# Patient Record
Sex: Male | Born: 1950 | Race: White | Hispanic: No
Health system: Western US, Academic
[De-identification: ages and names within clinical notes are randomized; demographics above are authoritative.]

## PROBLEM LIST (undated history)

## (undated) DIAGNOSIS — E119 Type 2 diabetes mellitus without complications: Secondary | ICD-10-CM

## (undated) DIAGNOSIS — I4891 Unspecified atrial fibrillation: Secondary | ICD-10-CM

## (undated) DIAGNOSIS — I2510 Atherosclerotic heart disease of native coronary artery without angina pectoris: Secondary | ICD-10-CM

## (undated) DIAGNOSIS — I639 Cerebral infarction, unspecified: Secondary | ICD-10-CM

---

## 2017-09-10 ENCOUNTER — Inpatient Hospital Stay (HOSPITAL_COMMUNITY)
Admission: EM | Admit: 2017-09-10 | Discharge: 2017-09-14 | DRG: 303 | Discharge status: Against Medical Advice | Payer: Medicare Other | Attending: Internal Medicine | Admitting: Internal Medicine

## 2017-09-10 ENCOUNTER — Emergency Department (HOSPITAL_COMMUNITY): Payer: Medicare Other

## 2017-09-10 ENCOUNTER — Encounter (HOSPITAL_COMMUNITY): Payer: Self-pay | Admitting: Emergency Medicine

## 2017-09-10 DIAGNOSIS — I252 Old myocardial infarction: Secondary | ICD-10-CM | POA: Diagnosis not present

## 2017-09-10 DIAGNOSIS — I110 Hypertensive heart disease with heart failure: Secondary | ICD-10-CM | POA: Diagnosis not present

## 2017-09-10 DIAGNOSIS — I447 Left bundle-branch block, unspecified: Secondary | ICD-10-CM | POA: Diagnosis present

## 2017-09-10 DIAGNOSIS — I482 Chronic atrial fibrillation: Secondary | ICD-10-CM | POA: Diagnosis present

## 2017-09-10 DIAGNOSIS — Z7982 Long term (current) use of aspirin: Secondary | ICD-10-CM

## 2017-09-10 DIAGNOSIS — Z955 Presence of coronary angioplasty implant and graft: Secondary | ICD-10-CM | POA: Diagnosis not present

## 2017-09-10 DIAGNOSIS — D649 Anemia, unspecified: Secondary | ICD-10-CM | POA: Diagnosis present

## 2017-09-10 DIAGNOSIS — J449 Chronic obstructive pulmonary disease, unspecified: Secondary | ICD-10-CM | POA: Diagnosis present

## 2017-09-10 DIAGNOSIS — F0390 Unspecified dementia without behavioral disturbance: Secondary | ICD-10-CM | POA: Diagnosis present

## 2017-09-10 DIAGNOSIS — N289 Disorder of kidney and ureter, unspecified: Secondary | ICD-10-CM

## 2017-09-10 DIAGNOSIS — F17210 Nicotine dependence, cigarettes, uncomplicated: Secondary | ICD-10-CM | POA: Diagnosis present

## 2017-09-10 DIAGNOSIS — Z794 Long term (current) use of insulin: Secondary | ICD-10-CM | POA: Diagnosis not present

## 2017-09-10 DIAGNOSIS — Z7951 Long term (current) use of inhaled steroids: Secondary | ICD-10-CM

## 2017-09-10 DIAGNOSIS — Z951 Presence of aortocoronary bypass graft: Secondary | ICD-10-CM

## 2017-09-10 DIAGNOSIS — N179 Acute kidney failure, unspecified: Secondary | ICD-10-CM | POA: Diagnosis not present

## 2017-09-10 DIAGNOSIS — R9439 Abnormal result of other cardiovascular function study: Secondary | ICD-10-CM | POA: Diagnosis present

## 2017-09-10 DIAGNOSIS — Z79899 Other long term (current) drug therapy: Secondary | ICD-10-CM

## 2017-09-10 DIAGNOSIS — I25110 Atherosclerotic heart disease of native coronary artery with unstable angina pectoris: Principal | ICD-10-CM | POA: Diagnosis present

## 2017-09-10 DIAGNOSIS — I493 Ventricular premature depolarization: Secondary | ICD-10-CM | POA: Diagnosis present

## 2017-09-10 DIAGNOSIS — R079 Chest pain, unspecified: Secondary | ICD-10-CM | POA: Diagnosis present

## 2017-09-10 DIAGNOSIS — I459 Conduction disorder, unspecified: Secondary | ICD-10-CM | POA: Diagnosis present

## 2017-09-10 DIAGNOSIS — I480 Paroxysmal atrial fibrillation: Secondary | ICD-10-CM | POA: Diagnosis present

## 2017-09-10 DIAGNOSIS — Z95810 Presence of automatic (implantable) cardiac defibrillator: Secondary | ICD-10-CM

## 2017-09-10 DIAGNOSIS — I5022 Chronic systolic (congestive) heart failure: Secondary | ICD-10-CM | POA: Diagnosis not present

## 2017-09-10 DIAGNOSIS — E119 Type 2 diabetes mellitus without complications: Secondary | ICD-10-CM | POA: Diagnosis present

## 2017-09-10 DIAGNOSIS — Z8673 Personal history of transient ischemic attack (TIA), and cerebral infarction without residual deficits: Secondary | ICD-10-CM

## 2017-09-10 DIAGNOSIS — I255 Ischemic cardiomyopathy: Secondary | ICD-10-CM | POA: Diagnosis not present

## 2017-09-10 DIAGNOSIS — I200 Unstable angina: Secondary | ICD-10-CM

## 2017-09-10 DIAGNOSIS — Z5321 Procedure and treatment not carried out due to patient leaving prior to being seen by health care provider: Secondary | ICD-10-CM | POA: Diagnosis present

## 2017-09-10 DIAGNOSIS — I2510 Atherosclerotic heart disease of native coronary artery without angina pectoris: Secondary | ICD-10-CM | POA: Diagnosis present

## 2017-09-10 DIAGNOSIS — E785 Hyperlipidemia, unspecified: Secondary | ICD-10-CM | POA: Diagnosis not present

## 2017-09-10 DIAGNOSIS — Z7901 Long term (current) use of anticoagulants: Secondary | ICD-10-CM | POA: Diagnosis not present

## 2017-09-10 DIAGNOSIS — Z79891 Long term (current) use of opiate analgesic: Secondary | ICD-10-CM

## 2017-09-10 HISTORY — DX: Type 2 diabetes mellitus without complications: E11.9

## 2017-09-10 HISTORY — DX: Unspecified atrial fibrillation: I48.91

## 2017-09-10 HISTORY — DX: Cerebral infarction, unspecified: I63.9

## 2017-09-10 HISTORY — DX: Atherosclerotic heart disease of native coronary artery without angina pectoris: I25.10

## 2017-09-10 LAB — BASIC METABOLIC PANEL
Anion gap: 11 (ref 5–15)
BUN: 14 mg/dL (ref 8–23)
CALCIUM: 8.8 mg/dL — AB (ref 8.9–10.3)
CO2: 28 mmol/L (ref 22–32)
CREATININE: 1.44 mg/dL — AB (ref 0.61–1.24)
Chloride: 97 mmol/L — ABNORMAL LOW (ref 98–111)
GFR calc Af Amer: 56 mL/min — ABNORMAL LOW (ref >60)
GFR calc non Af Amer: 48 mL/min — ABNORMAL LOW (ref >60)
Glucose, Bld: 260 mg/dL — ABNORMAL HIGH (ref 70–99)
Potassium: 4.3 mmol/L (ref 3.5–5.1)
SODIUM: 136 mmol/L (ref 135–145)

## 2017-09-10 LAB — CBC
HCT: 37.5 % — ABNORMAL LOW (ref 39.0–52.0)
Hemoglobin: 11.6 g/dL — ABNORMAL LOW (ref 13.0–17.0)
MCH: 29.2 pg (ref 26.0–34.0)
MCHC: 30.9 g/dL (ref 30.0–36.0)
MCV: 94.5 fL (ref 78.0–100.0)
PLATELETS: 177 10*3/uL (ref 150–400)
RBC: 3.97 MIL/uL — ABNORMAL LOW (ref 4.22–5.81)
RDW: 20.6 % — ABNORMAL HIGH (ref 11.5–15.5)
WBC: 5.8 10*3/uL (ref 4.0–10.5)

## 2017-09-10 LAB — I-STAT TROPONIN, ED: TROPONIN I, POC: 0 ng/mL (ref 0.00–0.08)

## 2017-09-10 LAB — PROTIME-INR
INR: 1.19
PROTHROMBIN TIME: 15 s (ref 11.4–15.2)

## 2017-09-10 IMAGING — CR DG CHEST 2V
3 series · 3 of 3 positions shown · non-contrast
Comparison: None.

CLINICAL DATA: Acute chest pain and shortness of breath today.

EXAM:
CHEST - 2 VIEW

[chest lat (1 of 2)]
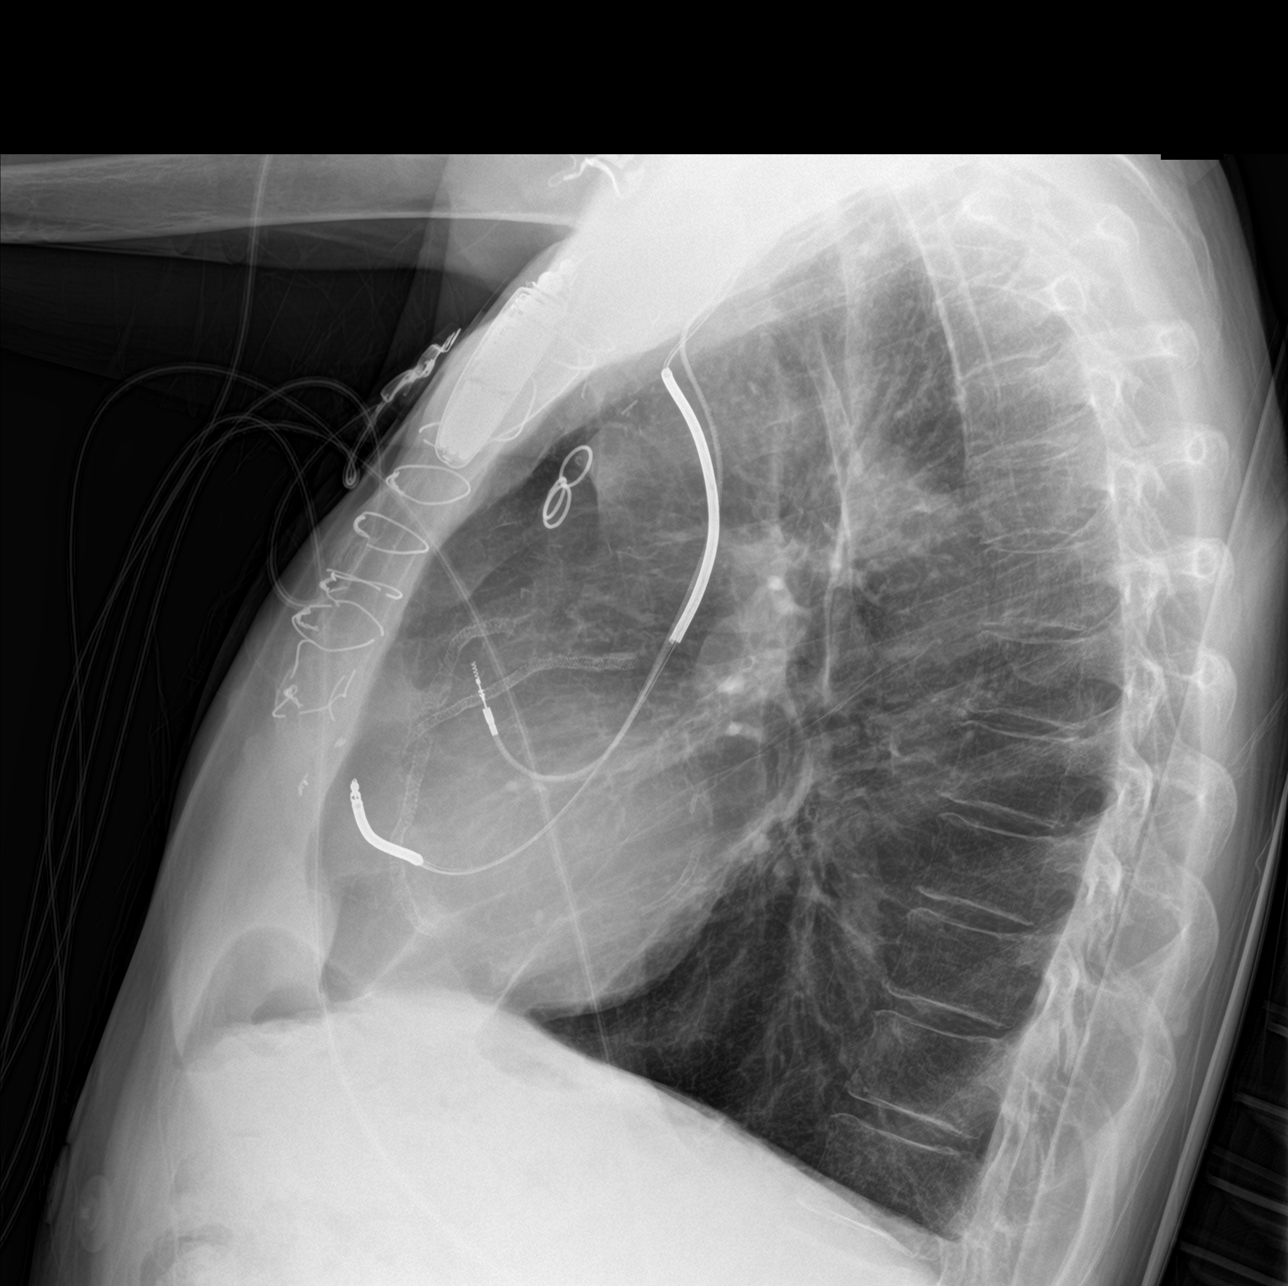

[chest ap]
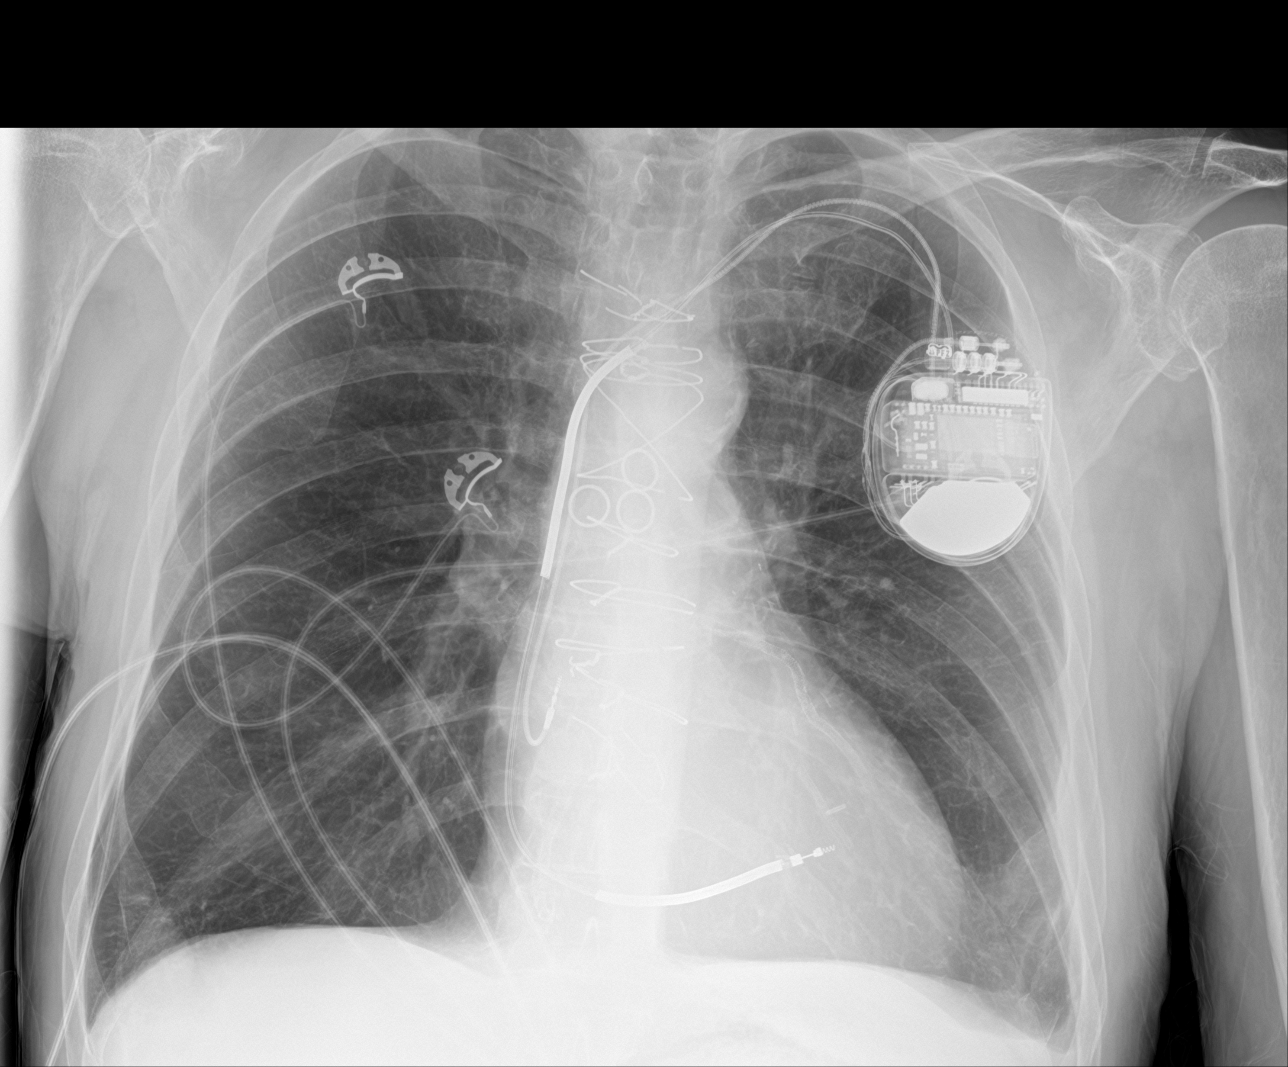

[chest lat (2 of 2)]
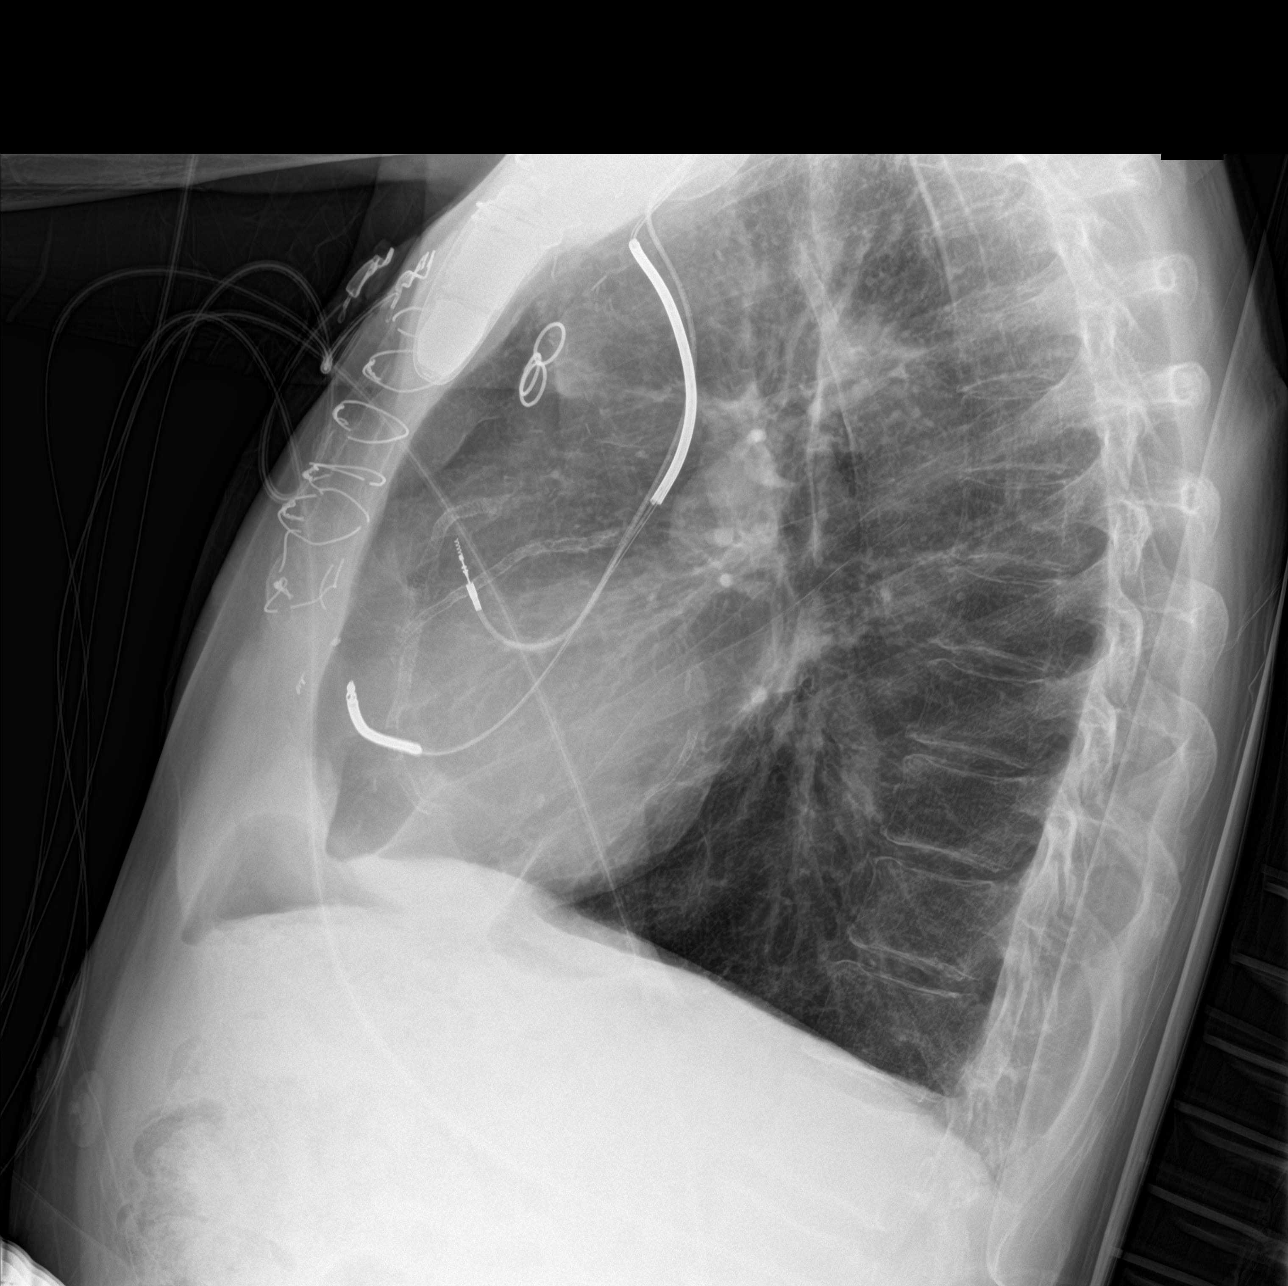

[3 of 3 positions shown; findings below may reference images not displayed]

FINDINGS: The cardiomediastinal silhouette is unremarkable.

CABG changes, coronary stents and LEFT AICD/pacemaker noted.

There is no evidence of focal airspace disease, pulmonary edema,
suspicious pulmonary nodule/mass, pleural effusion, or pneumothorax.

No acute bony abnormalities are identified.
IMPRESSION: No evidence of acute cardiopulmonary disease.

## 2017-09-10 MED ORDER — ASPIRIN 81 MG PO CHEW
324.0000 mg | CHEWABLE_TABLET | Freq: Once | ORAL | Status: AC
  Administered 2017-09-10: 324 mg via ORAL
  Filled 2017-09-10: qty 4

## 2017-09-10 NOTE — ED Provider Notes (Signed)
[facility] EMERGENCY DEPARTMENT Provider Note   CSN: [id number] Arrival date & time: 09/10/17  2224     History   Chief Complaint Chief Complaint  Patient presents with  . Chest Pain    HPI Larry Leach is a 67 y.o. male.  The history is provided by the patient and a relative. The history is limited by the condition of the patient (level 5 dementia).  Chest Pain   This is a recurrent problem. The current episode started 1 to 2 hours ago. The problem occurs constantly. The problem has not changed since onset.The pain is associated with rest (watching tv). The pain is present in the substernal region. The pain is moderate. The quality of the pain is described as dull. The pain radiates to the left neck and left shoulder. Exacerbated by: none. Associated symptoms include nausea. Pertinent negatives include no abdominal pain, no cough, no diaphoresis, no dizziness, no fever, no hemoptysis, no lower extremity edema, no orthopnea, no palpitations and no vomiting. He has tried nothing for the symptoms. The treatment provided no relief. Risk factors include being elderly and male gender.  His past medical history is significant for CAD and MI.  Pertinent negatives for past medical history include no aneurysm.  Pertinent negatives for family medical history include: no aortic dissection.  Procedure history is positive for cardiac catheterization.    Past Medical History:  Diagnosis Date  . Coronary artery disease   . Diabetes mellitus without complication (HCC)   . Stroke [facility])     There are no active problems to display for this patient.   History reviewed. No pertinent surgical history.      Home Medications    Prior to Admission medications   Not on File    Family History No family history on file.  Social History Social History   Tobacco Use  . Smoking status: Not on file  Substance Use Topics  . Alcohol use: Not on file  . Drug use: Not on  file     Allergies   Patient has no known allergies.   Review of Systems Review of Systems  Constitutional: Negative for diaphoresis and fever.  Eyes: Negative for photophobia.  Respiratory: Negative for cough and hemoptysis.   Cardiovascular: Positive for chest pain. Negative for palpitations, orthopnea and leg swelling.  Gastrointestinal: Positive for nausea. Negative for abdominal pain and vomiting.  Genitourinary: Negative for flank pain.  Musculoskeletal: Positive for arthralgias.  Neurological: Negative for dizziness and speech difficulty.  All other systems reviewed and are negative.    Physical Exam Updated Vital Signs BP (!) 143/82 (BP Location: Left Arm)   Pulse 85   Temp 97.7 F (36.5 C) (Oral)   Resp 20   Ht 6' (1.829 m)   Wt 59 kg (130 lb)   SpO2 100%   BMI 17.63 kg/m   Physical Exam  Constitutional: He is oriented to person, place, and time. He appears well-developed and well-nourished.  HENT:  Head: Normocephalic and atraumatic.  Right Ear: External ear normal.  Left Ear: External ear normal.  Mouth/Throat: Oropharynx is clear and moist. No oropharyngeal exudate.  Eyes: Pupils are equal, round, and reactive to light. Conjunctivae are normal.  Neck: Normal range of motion. Neck supple.  Cardiovascular: Normal rate, regular rhythm, normal heart sounds and intact distal pulses.  Pulmonary/Chest: Effort normal and breath sounds normal. No stridor. No respiratory distress. He has no wheezes. He has no rales.  Abdominal: Soft. Bowel sounds  are normal. He exhibits no mass. There is no tenderness. There is no rebound and no guarding.  Musculoskeletal: Normal range of motion. He exhibits no edema or tenderness.  Neurological: He is alert and oriented to person, place, and time. He displays normal reflexes.  Skin: Skin is warm and dry. Capillary refill takes less than 2 seconds. He is not diaphoretic.  Psychiatric: He has a normal mood and affect.     ED  Treatments / Results  Labs (all labs ordered are listed, but only abnormal results are displayed) Results for orders placed or performed during the hospital encounter of 09/10/17  Basic metabolic panel  Result Value Ref Range   Sodium 136 135 - 145 mmol/L   Potassium 4.3 3.5 - 5.1 mmol/L   Chloride 97 (L) 98 - 111 mmol/L   CO2 28 22 - 32 mmol/L   Glucose, Bld 260 (H) 70 - 99 mg/dL   BUN 14 8 - 23 mg/dL   Creatinine, Ser 1.61 (H) 0.61 - 1.24 mg/dL   Calcium 8.8 (L) 8.9 - 10.3 mg/dL   GFR calc non Af Amer 48 (L) >60 mL/min   GFR calc Af Amer 56 (L) >60 mL/min   Anion gap 11 5 - 15  CBC  Result Value Ref Range   WBC 5.8 4.0 - 10.5 K/uL   RBC 3.97 (L) 4.22 - 5.81 MIL/uL   Hemoglobin 11.6 (L) 13.0 - 17.0 g/dL   HCT 09.6 (L) 04.5 - 40.9 %   MCV 94.5 78.0 - 100.0 fL   MCH 29.2 26.0 - 34.0 pg   MCHC 30.9 30.0 - 36.0 g/dL   RDW 81.1 (H) 91.4 - 78.2 %   Platelets 177 150 - 400 K/uL  I-stat troponin, ED  Result Value Ref Range   Troponin i, poc 0.00 0.00 - 0.08 ng/mL   Comment 3           Dg Chest 2 View  Result Date: 09/10/2017 CLINICAL DATA:  Acute chest pain and shortness of breath today. EXAM: CHEST - 2 VIEW COMPARISON:  None. FINDINGS: The cardiomediastinal silhouette is unremarkable. CABG changes, coronary stents and LEFT AICD/pacemaker noted. There is no evidence of focal airspace disease, pulmonary edema, suspicious pulmonary nodule/mass, pleural effusion, or pneumothorax. No acute bony abnormalities are identified. IMPRESSION: No evidence of acute cardiopulmonary disease. Electronically Signed   By: Harmon Pier M.D.   On: 09/10/2017 23:00    EKG EKG Interpretation  Date/Time:  Thursday September 10 2017 22:26:37 EDT Ventricular Rate:  86 PR Interval:  180 QRS Duration: 126 QT Interval:  450 QTC Calculation: 538 R Axis:   100 Text Interpretation:  Atrial-paced rhythm Rightward axis Non-specific intra-ventricular conduction block Possible Inferior infarct , age undetermined  Abnormal ECG No previous ECGs available Confirmed by Frederick Peers [phone]) on 09/10/2017 10:43:39 PM   Radiology Dg Chest 2 View  Result Date: 09/10/2017 CLINICAL DATA:  Acute chest pain and shortness of breath today. EXAM: CHEST - 2 VIEW COMPARISON:  None. FINDINGS: The cardiomediastinal silhouette is unremarkable. CABG changes, coronary stents and LEFT AICD/pacemaker noted. There is no evidence of focal airspace disease, pulmonary edema, suspicious pulmonary nodule/mass, pleural effusion, or pneumothorax. No acute bony abnormalities are identified. IMPRESSION: No evidence of acute cardiopulmonary disease. Electronically Signed   By: Harmon Pier M.D.   On: 09/10/2017 23:00    Procedures Procedures (including critical care time)  Medications Ordered in ED Medications  aspirin chewable tablet 324 mg (324 mg Oral Given 09/10/17  2335)       Final Clinical Impressions(s) / ED Diagnoses   Heart score of 7 will admit to medicine.     Milliana Reddoch, MD 09/10/17 2349

## 2017-09-10 NOTE — ED Triage Notes (Signed)
Pt has extensive cardiac history.  Today he was watching TV he began to experience left sided chest pain, also began to be SOB.  Pt denies sweating but states he was weak and nauseas.  "episodes come and go."  Pain radiates to left shoulder and neck.  [place]

## 2017-09-10 NOTE — ED Notes (Addendum)
Recollect blood for istat trop and blue top

## 2017-09-11 ENCOUNTER — Other Ambulatory Visit: Payer: Self-pay

## 2017-09-11 ENCOUNTER — Encounter (HOSPITAL_COMMUNITY): Payer: Self-pay

## 2017-09-11 DIAGNOSIS — I5022 Chronic systolic (congestive) heart failure: Secondary | ICD-10-CM

## 2017-09-11 DIAGNOSIS — I482 Chronic atrial fibrillation: Secondary | ICD-10-CM | POA: Diagnosis not present

## 2017-09-11 DIAGNOSIS — Z8673 Personal history of transient ischemic attack (TIA), and cerebral infarction without residual deficits: Secondary | ICD-10-CM

## 2017-09-11 DIAGNOSIS — I447 Left bundle-branch block, unspecified: Secondary | ICD-10-CM | POA: Diagnosis not present

## 2017-09-11 DIAGNOSIS — I200 Unstable angina: Secondary | ICD-10-CM | POA: Diagnosis present

## 2017-09-11 DIAGNOSIS — E119 Type 2 diabetes mellitus without complications: Secondary | ICD-10-CM

## 2017-09-11 DIAGNOSIS — R079 Chest pain, unspecified: Secondary | ICD-10-CM | POA: Diagnosis not present

## 2017-09-11 DIAGNOSIS — I110 Hypertensive heart disease with heart failure: Secondary | ICD-10-CM | POA: Diagnosis not present

## 2017-09-11 DIAGNOSIS — I255 Ischemic cardiomyopathy: Secondary | ICD-10-CM | POA: Diagnosis not present

## 2017-09-11 DIAGNOSIS — Z7901 Long term (current) use of anticoagulants: Secondary | ICD-10-CM | POA: Diagnosis not present

## 2017-09-11 DIAGNOSIS — Z5321 Procedure and treatment not carried out due to patient leaving prior to being seen by health care provider: Secondary | ICD-10-CM | POA: Diagnosis not present

## 2017-09-11 DIAGNOSIS — J449 Chronic obstructive pulmonary disease, unspecified: Secondary | ICD-10-CM | POA: Diagnosis not present

## 2017-09-11 DIAGNOSIS — N179 Acute kidney failure, unspecified: Secondary | ICD-10-CM | POA: Diagnosis not present

## 2017-09-11 DIAGNOSIS — I25700 Atherosclerosis of coronary artery bypass graft(s), unspecified, with unstable angina pectoris: Secondary | ICD-10-CM

## 2017-09-11 DIAGNOSIS — F0390 Unspecified dementia without behavioral disturbance: Secondary | ICD-10-CM | POA: Diagnosis not present

## 2017-09-11 DIAGNOSIS — N289 Disorder of kidney and ureter, unspecified: Secondary | ICD-10-CM

## 2017-09-11 DIAGNOSIS — D649 Anemia, unspecified: Secondary | ICD-10-CM | POA: Diagnosis not present

## 2017-09-11 DIAGNOSIS — Z95810 Presence of automatic (implantable) cardiac defibrillator: Secondary | ICD-10-CM | POA: Diagnosis not present

## 2017-09-11 DIAGNOSIS — I25119 Atherosclerotic heart disease of native coronary artery with unspecified angina pectoris: Secondary | ICD-10-CM | POA: Diagnosis not present

## 2017-09-11 DIAGNOSIS — Z951 Presence of aortocoronary bypass graft: Secondary | ICD-10-CM | POA: Diagnosis not present

## 2017-09-11 DIAGNOSIS — I480 Paroxysmal atrial fibrillation: Secondary | ICD-10-CM | POA: Diagnosis present

## 2017-09-11 DIAGNOSIS — Z794 Long term (current) use of insulin: Secondary | ICD-10-CM | POA: Diagnosis not present

## 2017-09-11 DIAGNOSIS — I25110 Atherosclerotic heart disease of native coronary artery with unstable angina pectoris: Secondary | ICD-10-CM | POA: Diagnosis not present

## 2017-09-11 LAB — HIV ANTIBODY (ROUTINE TESTING W REFLEX): HIV Screen 4th Generation wRfx: NONREACTIVE

## 2017-09-11 LAB — CBG MONITORING, ED: GLUCOSE-CAPILLARY: 224 mg/dL — AB (ref 70–99)

## 2017-09-11 LAB — BASIC METABOLIC PANEL
ANION GAP: 9 (ref 5–15)
BUN: 12 mg/dL (ref 8–23)
CALCIUM: 8.8 mg/dL — AB (ref 8.9–10.3)
CO2: 30 mmol/L (ref 22–32)
CREATININE: 1.28 mg/dL — AB (ref 0.61–1.24)
Chloride: 105 mmol/L (ref 98–111)
GFR calc Af Amer: >60 mL/min (ref >60)
GFR, EST NON AFRICAN AMERICAN: 56 mL/min — AB (ref >60)
Glucose, Bld: 85 mg/dL (ref 70–99)
Potassium: 4 mmol/L (ref 3.5–5.1)
Sodium: 144 mmol/L (ref 135–145)

## 2017-09-11 LAB — MRSA PCR SCREENING: MRSA by PCR: NEGATIVE

## 2017-09-11 LAB — GLUCOSE, CAPILLARY
GLUCOSE-CAPILLARY: 213 mg/dL — AB (ref 70–99)
GLUCOSE-CAPILLARY: 237 mg/dL — AB (ref 70–99)
Glucose-Capillary: 74 mg/dL (ref 70–99)
Glucose-Capillary: 125 mg/dL — ABNORMAL HIGH (ref 70–99)

## 2017-09-11 LAB — TROPONIN I: TROPONIN I: 0.03 ng/mL — AB (ref <0.03)

## 2017-09-11 MED ORDER — ASPIRIN 325 MG PO TABS
325.0000 mg | ORAL_TABLET | Freq: Every day | ORAL | Status: DC
  Administered 2017-09-11: 325 mg via ORAL
  Filled 2017-09-11: qty 1

## 2017-09-11 MED ORDER — SODIUM CHLORIDE 0.9% FLUSH
3.0000 mL | Freq: Two times a day (BID) | INTRAVENOUS | Status: DC
  Administered 2017-09-11 (×2): 3 mL via INTRAVENOUS

## 2017-09-11 MED ORDER — SODIUM CHLORIDE 0.9 % IV SOLN: 250.0000 mL | INTRAVENOUS | Status: DC | PRN

## 2017-09-11 MED ORDER — ATORVASTATIN CALCIUM 40 MG PO TABS
40.0000 mg | ORAL_TABLET | Freq: Every day | ORAL | Status: DC
  Administered 2017-09-11 – 2017-09-13 (×3): 40 mg via ORAL
  Filled 2017-09-11 (×3): qty 1

## 2017-09-11 MED ORDER — OXYCODONE-ACETAMINOPHEN 5-325 MG PO TABS
2.0000 | ORAL_TABLET | ORAL | Status: DC | PRN
  Administered 2017-09-11 – 2017-09-14 (×16): 2 via ORAL
  Filled 2017-09-11 (×16): qty 2

## 2017-09-11 MED ORDER — ONDANSETRON HCL 4 MG/2ML IJ SOLN: 4.0000 mg | Freq: Four times a day (QID) | INTRAMUSCULAR | Status: DC | PRN

## 2017-09-11 MED ORDER — SODIUM CHLORIDE 0.9% FLUSH: 3.0000 mL | INTRAVENOUS | Status: DC | PRN

## 2017-09-11 MED ORDER — ALPRAZOLAM 0.25 MG PO TABS: 0.2500 mg | ORAL_TABLET | Freq: Two times a day (BID) | ORAL | Status: DC | PRN

## 2017-09-11 MED ORDER — INSULIN ASPART 100 UNIT/ML ~~LOC~~ SOLN
0.0000 [IU] | Freq: Every day | SUBCUTANEOUS | Status: DC
  Administered 2017-09-11 (×2): 2 [IU] via SUBCUTANEOUS
  Filled 2017-09-11: qty 1

## 2017-09-11 MED ORDER — NITROGLYCERIN 0.4 MG SL SUBL
0.4000 mg | SUBLINGUAL_TABLET | SUBLINGUAL | Status: DC | PRN
  Administered 2017-09-11: 0.4 mg via SUBLINGUAL
  Filled 2017-09-11: qty 1

## 2017-09-11 MED ORDER — INSULIN DETEMIR 100 UNIT/ML ~~LOC~~ SOLN
5.0000 [IU] | Freq: Two times a day (BID) | SUBCUTANEOUS | Status: DC
  Administered 2017-09-11 (×2): 5 [IU] via SUBCUTANEOUS
  Filled 2017-09-11 (×3): qty 0.05

## 2017-09-11 MED ORDER — MOMETASONE FURO-FORMOTEROL FUM 200-5 MCG/ACT IN AERO
2.0000 | INHALATION_SPRAY | Freq: Two times a day (BID) | RESPIRATORY_TRACT | Status: DC
  Administered 2017-09-11 – 2017-09-14 (×6): 2 via RESPIRATORY_TRACT
  Filled 2017-09-11 (×2): qty 8.8

## 2017-09-11 MED ORDER — ACETAMINOPHEN 325 MG PO TABS: 650.0000 mg | ORAL_TABLET | ORAL | Status: DC | PRN

## 2017-09-11 MED ORDER — HEPARIN (PORCINE) IN NACL 100-0.45 UNIT/ML-% IJ SOLN
1000.0000 [IU]/h | INTRAMUSCULAR | Status: DC
  Administered 2017-09-11: 750 [IU]/h via INTRAVENOUS
  Administered 2017-09-12 – 2017-09-13 (×2): 1000 [IU]/h via INTRAVENOUS
  Filled 2017-09-11 (×3): qty 250

## 2017-09-11 MED ORDER — INSULIN DETEMIR 100 UNIT/ML ~~LOC~~ SOLN
8.0000 [IU] | Freq: Two times a day (BID) | SUBCUTANEOUS | Status: DC
  Administered 2017-09-11 – 2017-09-13 (×5): 8 [IU] via SUBCUTANEOUS
  Filled 2017-09-11 (×6): qty 0.08

## 2017-09-11 MED ORDER — MORPHINE SULFATE (PF) 4 MG/ML IV SOLN: 2.0000 mg | INTRAVENOUS | Status: DC | PRN

## 2017-09-11 MED ORDER — APIXABAN 5 MG PO TABS
5.0000 mg | ORAL_TABLET | Freq: Two times a day (BID) | ORAL | Status: DC
  Administered 2017-09-11: 5 mg via ORAL
  Filled 2017-09-11 (×2): qty 1

## 2017-09-11 MED ORDER — ASPIRIN 81 MG PO CHEW
81.0000 mg | CHEWABLE_TABLET | Freq: Every day | ORAL | Status: DC
  Administered 2017-09-12 – 2017-09-13 (×2): 81 mg via ORAL
  Filled 2017-09-11 (×2): qty 1

## 2017-09-11 MED ORDER — INSULIN ASPART 100 UNIT/ML ~~LOC~~ SOLN
0.0000 [IU] | Freq: Three times a day (TID) | SUBCUTANEOUS | Status: DC
  Administered 2017-09-11: 1 [IU] via SUBCUTANEOUS
  Administered 2017-09-11: 3 [IU] via SUBCUTANEOUS
  Administered 2017-09-12 (×2): 2 [IU] via SUBCUTANEOUS
  Administered 2017-09-13 (×2): 1 [IU] via SUBCUTANEOUS

## 2017-09-11 MED ORDER — PNEUMOCOCCAL VAC POLYVALENT 25 MCG/0.5ML IJ INJ
0.5000 mL | INJECTION | INTRAMUSCULAR | Status: DC
  Filled 2017-09-11 (×2): qty 0.5

## 2017-09-11 MED ORDER — TIOTROPIUM BROMIDE MONOHYDRATE 18 MCG IN CAPS
18.0000 ug | ORAL_CAPSULE | Freq: Every day | RESPIRATORY_TRACT | Status: DC
  Administered 2017-09-11 – 2017-09-14 (×3): 18 ug via RESPIRATORY_TRACT
  Filled 2017-09-11 (×2): qty 5

## 2017-09-11 NOTE — Plan of Care (Signed)
Pt compliant with use of call light to request assistance, call light within reach, family at bedside.  Raymon MuttonGwen Damisha Wolff RN

## 2017-09-11 NOTE — Progress Notes (Signed)
Pt arrived to unit accompanied by ER nurse and son. CHG wipes used to wipe down patient and MRSA swab obtained. Pt placed on tele. Pt has no complaints. Will continue to monitor the pt.

## 2017-09-11 NOTE — H&P (Signed)
History and Physical    Larry Leach ZOX:[id number] DOB: 05/23/1950 DOA: 09/10/2017  PCP: System, Pcp Not In; visiting from [place]  Patient coming from: Home   Chief Complaint: Chest pain   HPI: Larry Leach is a 67 y.o. male with medical history significant for dementia, history of stroke, coronary artery disease status post CABG, insulin-dependent diabetes mellitus, and paroxysmal atrial fibrillation on Eliquis, now presenting to the emergency department for evaluation of chest pain.  [place] and was at rest, watching TV when he developed acute onset of pain in the left chest.  Pain is moderate in intensity, radiating to the left shoulder and neck, associated with mild dyspnea, waxing and waning, but with no alleviating or exacerbating factors identified.  He reports some mild nausea associated with this but denies diaphoresis.  He has not attempted any interventions for his symptoms.  Denies any recent fevers, chills, or increased cough or dyspnea.  Denies lower extremity swelling or tenderness.  ED Course: Upon arrival to the ED, patient is found to be afebrile, saturating well on room air, and with vitals otherwise normal.  EKG features in atrial-paced rhythm with nonspecific IVCD.  Chest x-ray is negative for acute cardiopulmonary disease.  Chemistry panel is notable for a creatinine of 1.44 and glucose of 260.  CBC features a mild normocytic anemia with hemoglobin of 11.6.  Troponin is undetectable.  Patient was given 324 mg of aspirin in the ED and his pacer is being interrogated.  He remains hemodynamically stable, in no apparent respiratory distress, appears comfortable, but continues to complain of some chest pain and will be admitted to the stepdown unit for ongoing evaluation and management.  Review of Systems:  All other systems reviewed and apart from HPI, are negative.  Past Medical History:  Diagnosis Date  . Coronary artery disease   . Diabetes  mellitus without complication (HCC)   . Stroke [facility])     History reviewed. No pertinent surgical history.   has no tobacco, alcohol, and drug history on file.  No Known Allergies  History reviewed. No pertinent family history.   Prior to Admission medications   Not on File    Physical Exam: Vitals:   09/10/17 2227  BP: (!) 143/82  Pulse: 85  Resp: 20  Temp: 97.7 F (36.5 C)  TempSrc: Oral  SpO2: 100%  Weight: 59 kg (130 lb)  Height: 6' (1.829 m)      Constitutional: NAD, calm, frail  Eyes: PERTLA, lids and conjunctivae normal ENMT: Mucous membranes are moist. Posterior pharynx clear of any exudate or lesions.   Neck: normal, supple, no masses, no thyromegaly Respiratory: breath sounds diminished bilaterally, no wheezing, no crackles. Normal respiratory effort.  Cardiovascular: S1 & S2 heard, regular rate and rhythm. No extremity edema.  Abdomen: No distension, no tenderness, soft. Bowel sounds normal.  Musculoskeletal: no clubbing / cyanosis. No joint deformity upper and lower extremities.  Skin: no significant rashes, lesions, ulcers. Warm, dry, well-perfused. Neurologic: No facial asymmetry. Sensation to light touch intact. Moving all extremities.  Psychiatric: Alert and oriented to person, place, and situation. Calm, cooperative.     Labs on Admission: I have personally reviewed following labs and imaging studies  CBC: Recent Labs  Lab 09/10/17 2238  WBC 5.8  HGB 11.6*  HCT 37.5*  MCV 94.5  PLT 177   Basic Metabolic Panel: Recent Labs  Lab 09/10/17 2238  NA 136  K 4.3  CL 97*  CO2  28  GLUCOSE 260*  BUN 14  CREATININE 1.44*  CALCIUM 8.8*   GFR: Estimated Creatinine Clearance: 41 mL/min (A) (by C-G formula based on SCr of 1.44 mg/dL (H)). Liver Function Tests: No results for input(s): AST, ALT, ALKPHOS, BILITOT, PROT, ALBUMIN in the last 168 hours. No results for input(s): LIPASE, AMYLASE in the last 168 hours. No results for input(s):  AMMONIA in the last 168 hours. Coagulation Profile: Recent Labs  Lab 09/10/17 2316  INR 1.19   Cardiac Enzymes: No results for input(s): CKTOTAL, CKMB, CKMBINDEX, TROPONINI in the last 168 hours. BNP (last 3 results) No results for input(s): PROBNP in the last 8760 hours. HbA1C: No results for input(s): HGBA1C in the last 72 hours. CBG: No results for input(s): GLUCAP in the last 168 hours. Lipid Profile: No results for input(s): CHOL, HDL, LDLCALC, TRIG, CHOLHDL, LDLDIRECT in the last 72 hours. Thyroid Function Tests: No results for input(s): TSH, T4TOTAL, FREET4, T3FREE, THYROIDAB in the last 72 hours. Anemia Panel: No results for input(s): VITAMINB12, FOLATE, FERRITIN, TIBC, IRON, RETICCTPCT in the last 72 hours. Urine analysis: No results found for: COLORURINE, APPEARANCEUR, LABSPEC, PHURINE, GLUCOSEU, HGBUR, BILIRUBINUR, KETONESUR, PROTEINUR, UROBILINOGEN, NITRITE, LEUKOCYTESUR Sepsis Labs: @LABRCNTIP (procalcitonin:4,lacticidven:4) )No results found for this or any previous visit (from the past 240 hour(s)).   Radiological Exams on Admission: Dg Chest 2 View  Result Date: 09/10/2017 CLINICAL DATA:  Acute chest pain and shortness of breath today. EXAM: CHEST - 2 VIEW COMPARISON:  None. FINDINGS: The cardiomediastinal silhouette is unremarkable. CABG changes, coronary stents and LEFT AICD/pacemaker noted. There is no evidence of focal airspace disease, pulmonary edema, suspicious pulmonary nodule/mass, pleural effusion, or pneumothorax. No acute bony abnormalities are identified. IMPRESSION: No evidence of acute cardiopulmonary disease. Electronically Signed   By: Harmon PierJeffrey  Hu M.D.   On: 09/10/2017 23:00    EKG: Independently reviewed. Atrial-paced rhythm, non-specific IVCD.   Assessment/Plan   1. Chest pain; CAD  - Presents with acute-onset of chest pain while at rest, radiating to shoulder and neck  - He has hx of CABG and stents  - Initial troponin undetectable, CXR  unremarkable, and EKG with paced rhythm and IVCD  - Treated with ASA 324 mg in ED  - Continue cardiac monitoring, obtain serial troponin measurements, NTG prn, continue ASA and statin   2. Paroxysmal atrial fibrillation  - In sinus rhythm on admission  - CHADS-VASc at least 3 (age, CAD, DM)  - Continue Eliquis   3. Insulin-dependent DM  - No A1c on file  - Managed at home with 70/30 insulin 10 units BID  - Check CBG's and start with Levemir 5 units BID and SSI with Novolog    4. COPD  - No wheezing or SOB  - Continue ICS/LABA and Spiriva    5. History of CVA - Family has not noted any acute deficit  - Continue ASA and statin    6. Renal insufficiency  - SCr is 1.44 on admission with no priors available  - Renally-dose medications, avoid nephrotoxins    DVT prophylaxis: Eliquis  Code Status: Full  Family Communication: Discussed with patient; son updated in ED  Consults called: None Admission status: Observation    Larry Deutscherimothy S Opyd, MD [facility] Pager [phone]  If 7PM-7AM, please contact night-coverage [email or web address] Password [email or web address]  09/11/2017, 12:06 AM

## 2017-09-11 NOTE — Progress Notes (Signed)
[place] TEAM 1 - Stepdown/ICU TEAM  Wilburn MylarKenneth Snodgrass  ZOX:[id number] DOB: 07-Feb-1951 DOA: 09/10/2017 PCP: System, Pcp Not In    Brief Narrative:  67 y.o. male with a hx of dementia, stroke, CAD s/p CABG, DM, and paroxysmal atrial fibrillation on Eliquis who presented to the ED w/ chest pain radiating to the left shoulder and neck, associated with mild dyspnea, waxing and waning, but with no alleviating or exacerbating factors identified.   Significant Events: 7/4 admit  Subjective: The patient is resting comfortably at the time of visit and is in no acute distress.  Assessment & Plan:  Unstable Angina - CAD s/p CABG 2000 + PCI 2018 Cardiology following - for cardiac cath early next week   Parox Afib CHADS-VASc is 7 - stopped DOAC and now on heparin gtt to allow for cardiac cath  DM CBG is climbing - adjust treatment and follow  COPD Well compensated at this time  Hx of CVA  Dementia   Acute renal injury  Gently hydrate and follow trend  Recent Labs  Lab 09/10/17 2238 09/11/17 0726  CREATININE 1.44* 1.28*     DVT prophylaxis: IV heparin  Code Status: FULL CODE Family Communication: no family present at time of exam  Disposition Plan: SDU  Consultants:  Cardiology   Antimicrobials:  none   Objective: Blood pressure 122/70, pulse 90, temperature 98.2 F (36.8 C), temperature source Oral, resp. rate 17, height 6' (1.829 m), weight 64 kg (141 lb), SpO2 95 %.  Intake/Output Summary (Last 24 hours) at 09/11/2017 1601 Last data filed at 09/11/2017 1227 Gross per 24 hour  Intake 480 ml  Output 1125 ml  Net -645 ml   Filed Weights   09/10/17 2227 09/11/17 0154  Weight: 59 kg (130 lb) 64 kg (141 lb)    Examination: General: No acute respiratory distress Lungs: Clear to auscultation bilaterally without wheezes or crackles Cardiovascular: Regular rate and rhythm without murmur gallop or rub normal S1 and S2 Abdomen: Nontender, nondistended, soft, bowel sounds  positive, no rebound, no ascites, no appreciable mass Extremities: No significant cyanosis, clubbing, or edema bilateral lower extremities  CBC: Recent Labs  Lab 09/10/17 2238  WBC 5.8  HGB 11.6*  HCT 37.5*  MCV 94.5  PLT 177   Basic Metabolic Panel: Recent Labs  Lab 09/10/17 2238 09/11/17 0726  NA 136 144  K 4.3 4.0  CL 97* 105  CO2 28 30  GLUCOSE 260* 85  BUN 14 12  CREATININE 1.44* 1.28*  CALCIUM 8.8* 8.8*   GFR: Estimated Creatinine Clearance: 50 mL/min (A) (by C-G formula based on SCr of 1.28 mg/dL (H)).  Liver Function Tests: No results for input(s): AST, ALT, ALKPHOS, BILITOT, PROT, ALBUMIN in the last 168 hours. No results for input(s): LIPASE, AMYLASE in the last 168 hours. No results for input(s): AMMONIA in the last 168 hours.  Coagulation Profile: Recent Labs  Lab 09/10/17 2316  INR 1.19    Cardiac Enzymes: Recent Labs  Lab 09/11/17 0244 09/11/17 0726 09/11/17 1248  TROPONINI 0.03* <0.03 <0.03    HbA1C: No results found for: HGBA1C  CBG: Recent Labs  Lab 09/11/17 0034 09/11/17 0818 09/11/17 1150  GLUCAP 224* 74 125*    Recent Results (from the past 240 hour(s))  MRSA PCR Screening     Status: None   Collection Time: 09/11/17  1:51 AM  Result Value Ref Range Status   MRSA by PCR NEGATIVE NEGATIVE Final    Comment:  The GeneXpert MRSA Assay (FDA approved for NASAL specimens only), is one component of a comprehensive MRSA colonization surveillance program. It is not intended to diagnose MRSA infection nor to guide or monitor treatment for MRSA infections. Performed at [facility], [address], [place], [place] [address]      Scheduled Meds: . aspirin  81 mg Oral Daily  . atorvastatin  40 mg Oral q1800  . insulin aspart  0-5 Units Subcutaneous QHS  . insulin aspart  0-9 Units Subcutaneous TID WC  . insulin detemir  5 Units Subcutaneous BID  . mometasone-formoterol  2 puff Inhalation BID  . [START ON  09/12/2017] pneumococcal 23 valent vaccine  0.5 mL Intramuscular Tomorrow-1000  . sodium chloride flush  3 mL Intravenous Q12H  . tiotropium  18 mcg Inhalation Daily   Continuous Infusions: . sodium chloride    . heparin       LOS: 0 days   Lonia Blood, MD [facility] Office  [phone] Pager - Text Page per Amion as per below:  On-Call/Text Page:      [email or web address]      password [email or web address]  If 7PM-7AM, please contact night-coverage [email or web address] Password [email or web address] 09/11/2017, 4:01 PM

## 2017-09-11 NOTE — Consult Note (Addendum)
Cardiology Consultation:   Patient ID: Larry MylarKenneth Leach; [id number]; November 12, 1950   Admit date: 09/10/2017 Date of Consult: 09/11/2017  Primary Care Provider: System, Pcp Not In Primary Cardiologist: None Primary Electrophysiologist:  None   Patient Profile:   Larry Leach is a 67 y.o. male with a PMH of CAD s/p CABG 2000 with subsequent PCI (last 2018), paroxysmal atrial fibrillation on eliquis, s/p ICD placement, CVA, DM type 2 on insulin, COPD, and dementia, who is being seen today for the evaluation of chest pain at the request of Dr. Sharon SellerMcclung.  History of Present Illness:   Larry Leach is visiting [place] from [place]. He reports developing acute onset left-sided chest pain on the evening of 09/10/17 while watching TV. He reported associated SOB, radiation of pain to left shoulder/neck, and mild nausea without vomiting. Initial episode lasted 2-3 minutes and resolved spontaneously. He reports pain felt similar to prior heart attacks. He has continued to have intermittent CP which does not seem to be exacerbated by activity.   He has not seen a Cardiologist since moving from [place] to [place] ~1-2 years ago. He states his last ischemic evaluation was a LHC 2018 for the evaluation of chest pain, at which time he had a stent placed to unknown artery/graft. He reports no complaints of CP since that time until 09/10/17. He reports having a low EF (20-30%).    At the time of this evaluation he is chest pain free. He reported an episode of CP this morning for which he received 1 SL nitro. He reports chronic SOB due to COPD. He continues to smoke 6 cigarettes a day. He denies orthopnea, PND, LE edema, exertional CP, DOE, fever, worsening cough/sputum production.   Hospital course: VSS. Labs notable for electrolytes wnl, Cr 1.44 (unknown baseline), Hgb 11.6, PLT 177, Trop 0.00> 0.03 - repeat pending. CXR without acute findings. EKG with atrial paced rhythm, rate 86. Patient was given ASA 324mg  overnight  and 1 SL nitro this morning. Cardiology evaluating for further recommendations.   Past Medical History:  Diagnosis Date  . Atrial fibrillation (HCC)   . Coronary artery disease   . Diabetes mellitus without complication (HCC)   . Stroke [facility](HCC)     History reviewed. No pertinent surgical history.   Home Medications:  Prior to Admission medications   Medication Sig Start Date End Date Taking? Authorizing Provider  apixaban (ELIQUIS) 5 MG TABS tablet Take 5 mg by mouth 2 (two) times daily.   Yes [provider]  aspirin 325 MG tablet Take 325 mg by mouth daily.   Yes [provider]  atorvastatin (LIPITOR) 40 MG tablet Take 40 mg by mouth daily.   Yes [provider]  Fluticasone-Salmeterol (ADVAIR) 250-50 MCG/DOSE AEPB Inhale 1 puff into the lungs 2 (two) times daily.   Yes [provider]  furosemide (LASIX) 20 MG tablet Take 20 mg by mouth 2 (two) times daily.   Yes [provider]  insulin NPH-regular Human (NOVOLIN 70/30) (70-30) 100 UNIT/ML injection Inject 10 Units into the skin 2 (two) times daily with a meal.   Yes [provider]  oxyCODONE-acetaminophen (PERCOCET/ROXICET) 5-325 MG tablet Take 2 tablets by mouth every 4 (four) hours as needed for severe pain.   Yes [provider]  tiotropium (SPIRIVA) 18 MCG inhalation capsule Place 18 mcg into inhaler and inhale daily.   Yes [provider]    Inpatient Medications: Scheduled Meds: . apixaban  5 mg Oral BID  . aspirin  325 mg Oral Daily  . atorvastatin  40 mg Oral q1800  . insulin aspart  0-5 Units Subcutaneous QHS  . insulin aspart  0-9 Units Subcutaneous TID WC  . insulin detemir  5 Units Subcutaneous BID  . mometasone-formoterol  2 puff Inhalation BID  . [START ON 09/12/2017] pneumococcal 23 valent vaccine  0.5 mL Intramuscular Tomorrow-1000  . sodium chloride flush  3 mL Intravenous Q12H  . tiotropium  18 mcg Inhalation Daily   Continuous  Infusions: . sodium chloride     PRN Meds: sodium chloride, acetaminophen, ALPRAZolam, morphine injection, nitroGLYCERIN, ondansetron (ZOFRAN) IV, sodium chloride flush  Allergies:   No Known Allergies  Social History:   Social History   Socioeconomic History  . Marital status: Legally Separated    Spouse name: Not on file  . Number of children: Not on file  . Years of education: Not on file  . Highest education level: Not on file  Occupational History  . Not on file  Social Needs  . Financial resource strain: Not on file  . Food insecurity:    Worry: Not on file    Inability: Not on file  . Transportation needs:    Medical: Not on file    Non-medical: Not on file  Tobacco Use  . Smoking status: Current Every Day Smoker    Packs/day: 0.50    Years: 20.00    Pack years: 10.00    Types: Cigarettes  Substance and Sexual Activity  . Alcohol use: Not Currently    Frequency: Never  . Drug use: Never  . Sexual activity: Not on file  Lifestyle  . Physical activity:    Days per week: Not on file    Minutes per session: Not on file  . Stress: Not on file  Relationships  . Social connections:    Talks on phone: Patient refused    Gets together: Patient refused    Attends religious service: Patient refused    Active member of club or organization: Patient refused    Attends meetings of clubs or organizations: Patient refused    Relationship status: Patient refused  . Intimate partner violence:    Fear of current or ex partner: Patient refused    Emotionally abused: Patient refused    Physically abused: Patient refused    Forced sexual activity: Patient refused  Other Topics Concern  . Not on file  Social History Narrative  . Not on file    Family History:   He reports father had CABG in his 18s.    ROS:  Please see the history of present illness.   All other ROS reviewed and negative.     Physical Exam/Data:   Vitals:   09/11/17 0030 09/11/17 0154 09/11/17  0528 09/11/17 0528  BP: 114/72 119/63 100/70 100/70  Pulse: 83 83 88 85  Resp: 15  17 17   Temp:  98.1 F (36.7 C) 98 F (36.7 C) 98 F (36.7 C)  TempSrc:  Oral Oral Oral  SpO2: 98% 97% 97% 98%  Weight:  141 lb (64 kg)    Height:  6' (1.829 m)      Intake/Output Summary (Last 24 hours) at 09/11/2017 0808 Last data filed at 09/11/2017 0529 Gross per 24 hour  Intake -  Output 225 ml  Net -225 ml   Filed Weights   09/10/17 2227 09/11/17 0154  Weight: 130 lb (59 kg) 141 lb (64 kg)   Body mass index is 19.12 kg/m.  General:  Thin male who appears older than stated age, laying in bed in no acute distress HEENT: sclera anicteric  Neck: no JVD Vascular: No carotid bruits; distal pulses 2+ bilaterally Cardiac:  normal S1, S2; RRR; no murmurs, gallops, or rubs Lungs:  Decreased breath sounds at lung bases with scattered wheezing Abd: NABS, soft, nontender, no hepatomegaly Ext: thin, no edema Musculoskeletal:  No deformities, BUE and BLE strength normal and equal Skin: warm and dry  Neuro:  CNs 2-12 intact, no focal abnormalities noted Psych:  Normal affect   EKG:  The EKG was personally reviewed and demonstrates:  atrial paced rhythm, rate 86.  Telemetry:  Telemetry was personally reviewed and demonstrates:  Atrial paced with occasional PVCs  Relevant CV Studies: None on file  Laboratory Data:  Chemistry Recent Labs  Lab 09/10/17 2238  NA 136  K 4.3  CL 97*  CO2 28  GLUCOSE 260*  BUN 14  CREATININE 1.44*  CALCIUM 8.8*  GFRNONAA 48*  GFRAA 56*  ANIONGAP 11    No results for input(s): PROT, ALBUMIN, AST, ALT, ALKPHOS, BILITOT in the last 168 hours. Hematology Recent Labs  Lab 09/10/17 2238  WBC 5.8  RBC 3.97*  HGB 11.6*  HCT 37.5*  MCV 94.5  MCH 29.2  MCHC 30.9  RDW 20.6*  PLT 177   Cardiac Enzymes Recent Labs  Lab 09/11/17 0244  TROPONINI 0.03*    Recent Labs  Lab 09/10/17 2320  TROPIPOC 0.00    BNPNo results for input(s): BNP, PROBNP in the  last 168 hours.  DDimer No results for input(s): DDIMER in the last 168 hours.  Radiology/Studies:  Dg Chest 2 View  Result Date: 09/10/2017 CLINICAL DATA:  Acute chest pain and shortness of breath today. EXAM: CHEST - 2 VIEW COMPARISON:  None. FINDINGS: The cardiomediastinal silhouette is unremarkable. CABG changes, coronary stents and LEFT AICD/pacemaker noted. There is no evidence of focal airspace disease, pulmonary edema, suspicious pulmonary nodule/mass, pleural effusion, or pneumothorax. No acute bony abnormalities are identified. IMPRESSION: No evidence of acute cardiopulmonary disease. Electronically Signed   By: Harmon Pier M.D.   On: 09/10/2017 23:00    Assessment and Plan:   1. CP in patient with CAD s/p CABG: p/w CP at rest with associated radiation of pain to L neck/shoulder, SOB, and nausea. Reports symptoms feel similar to prior MI's. Trop trend 0.01> 0.03> <0.03. EKG with atrial paced rhythm. Last ischemic evaluation was a LHC early 2018 with stent placement to unknown vessel. Last echo presumably at the same time and patient reports a low EF (20-30%?). Trop trend is not consistent with ACS, however he has not had issues with CP since stent placement in 2018 making presentation c/f unstable angina. He is on eliquis with last dose this morning, limiting ability to perform a cardiac catheterization today - Will plan for NST tomorrow to further evaluate chest pain -  Patient ate breakfast this morning.  - Continue ASA - will reduce dose to 81mg  daily to minimize bleeding risk given need for anticoagulation given Afib history - Will transition to a heparin gtt in anticipation of likely LHC after eliquis wash out - Would benefit from the addition of a BBlocker +/- ACEi/ARB if BP will allow - reportedly intolerant to BBlockers in the past due to hypotension  2. HTN: BP stable, not on medications - Continue to monitor  3. 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 On po lasix 20mg   BID at home; on hold for possible AKI - Cr 1.44 on admission, improved to 1.28 today. Appears euvolemic on exam today - Resume home lasix once Cr improved.  - Would benefit from the addition of a BBlocker +/- ACEi/ARB if BP will allow  4. Atrial fibrillation: atrial paced on telemetry/EKGs.  - Continue anticoagulation for CHA2DS2-VASc Score of 7  (CHF, HTN, DM, Vascular history, Age 58-75. And CVA history) - Will transition to a heparin gtt in anticipation of likely LHC   5. HLD: no lipids on file - Continue statin  6. DM type 2: on insulin BID; goal A1C <7 - Continue management per primary team  7. COPD: has chronic baseline SOB and cough; no recent change. Some scattered wheezing on exam today.  - Continue management per primary team      For questions or updates, please contact [facility] Please consult [email or web address] for contact info under Cardiology/STEMI.   Signed, Beatriz Stallion, PA-C  09/11/2017 8:08 AM [phone]  I have seen and examined the patient along with Beatriz Stallion, PA-C.  I have reviewed the chart, notes and new data.  I agree with PA/NP's note.  Key new complaints: Continue to have off and on episodes of chest discomfort lasting for about 3 minutes and reminiscent of his previous angina. His family reports that he was previously on a beta-blocker, but this was stopped "because it was doing more harm than good", apparently problems with low blood pressure. Key examination changes: He appears euvolemic, lying fully flat in bed without respiratory difficulty, no JVD, no leg edema, clear lungs, sternotomy scar and left subclavian defibrillator well-healed. Key new findings / data: Borderline abnormal cardiac troponin I, ECG is nondiagnostic due to intraventricular conduction delay. Echocardiogram pending.  No records of previous coronary revascularization procedures are available.  Creatinine is elevated, but  baseline is uncertain.  Reports that his defibrillator has not been interrogated in over a year.  Comprehensive ICD check today shows normal device function.  His [employer] device was implanted in May 2018.  It has recorded a handful of brief episodes of nonsustained ventricular tachycardia up to maximum of 2 seconds in duration, none since April 2019.  There has been no recorded atrial fibrillation.  He does not require ventricular pacing.  All lead and generator parameters are normal.  Thoracic impedance suggests volume overload earlier this month, but now back to baseline.  PLAN: He appears to have unstable angina, with evaluation made difficult by his memory problems, absence of old records from [place] and nondiagnostic ECG due to conduction abnormality. Coronary angiography would be reasonable, but he is fully anticoagulated and will probably require a femoral approach due to previous bypass surgery.  We will hold his anticoagulant and placed on intravenous heparin, potentially planning for cardiac catheterization on Monday. In the meantime we will get a [employer].  Unfortunately he has had coffee and has eaten a full breakfast so we cannot do that today. Echocardiogram pending. Reduce aspirin to 81 mg once daily. Try to get old records.  The patient's brother is trying to find out the name of the physician and facility in [place] where he last had cardiac care.   Thurmon Fair, MD, [facility] [phone] 09/11/2017, 11:01 AM

## 2017-09-11 NOTE — Progress Notes (Signed)
ANTICOAGULATION CONSULT NOTE - Initial Consult  Pharmacy Consult for Heparin Indication: chest pain/ACS  No Known Allergies  Patient Measurements: Height: 6' (182.9 cm) Weight: 141 lb (64 kg) IBW/kg (Calculated) : 77.6 Heparin Dosing Weight: 64kg  Vital Signs: Temp: 97.9 F (36.6 C) (07/05 0843) Temp Source: Oral (07/05 0843) BP: 107/69 (07/05 0843) Pulse Rate: 82 (07/05 0843)  Labs: Recent Labs    09/10/17 2238 09/10/17 2316 09/11/17 0244 09/11/17 0726  HGB 11.6*  --   --   --   HCT 37.5*  --   --   --   PLT 177  --   --   --   LABPROT  --  15.0  --   --   INR  --  1.19  --   --   CREATININE 1.44*  --   --  1.28*  TROPONINI  --   --  0.03* <0.03    Estimated Creatinine Clearance: 50 mL/min (A) (by C-G formula based on SCr of 1.28 mg/dL (H)).   Medical History: Past Medical History:  Diagnosis Date  . Atrial fibrillation (HCC)   . Coronary artery disease   . Diabetes mellitus without complication (HCC)   . Stroke [facility](HCC)     Medications:  Scheduled:  . aspirin  81 mg Oral Daily  . atorvastatin  40 mg Oral q1800  . insulin aspart  0-5 Units Subcutaneous QHS  . insulin aspart  0-9 Units Subcutaneous TID WC  . insulin detemir  5 Units Subcutaneous BID  . mometasone-formoterol  2 puff Inhalation BID  . [START ON 09/12/2017] pneumococcal 23 valent vaccine  0.5 mL Intramuscular Tomorrow-1000  . sodium chloride flush  3 mL Intravenous Q12H  . tiotropium  18 mcg Inhalation Daily    Assessment: Patient is 67 year old male who [facility] with left sided chest pain / SO[facility]. Patient on apixaban PTA. Pharmacy consulted to transition to heparin infusion. Last dose of apixaban 0834 on 7/5. Hgb 11.6, Plts 177, troponin 0.03, no documented signs or symptoms of bleeding. Due to timing of recent apixaban dosing, will monitor aPTT and heparin level until correlated.   Goal of Therapy:  APTT: 66s-102s Heparin level 0.3-0.7 units/ml Monitor platelets by  anticoagulation protocol: Yes   Plan:  Start heparin infusion at 750 units/hr at 2100 Check anti-Xa level in 6 hours and daily while on heparin Continue to monitor H&H and platelets  Ivin BootyJoshua J Denay Pleitez 09/11/2017,10:28 AM

## 2017-09-11 NOTE — Progress Notes (Signed)
Pt's troponin 0.03. [facility] paged.

## 2017-09-12 ENCOUNTER — Inpatient Hospital Stay (HOSPITAL_COMMUNITY): Payer: Medicare Other

## 2017-09-12 DIAGNOSIS — R079 Chest pain, unspecified: Secondary | ICD-10-CM

## 2017-09-12 DIAGNOSIS — J449 Chronic obstructive pulmonary disease, unspecified: Secondary | ICD-10-CM | POA: Diagnosis not present

## 2017-09-12 DIAGNOSIS — E119 Type 2 diabetes mellitus without complications: Secondary | ICD-10-CM | POA: Diagnosis not present

## 2017-09-12 DIAGNOSIS — I25110 Atherosclerotic heart disease of native coronary artery with unstable angina pectoris: Secondary | ICD-10-CM | POA: Diagnosis not present

## 2017-09-12 DIAGNOSIS — Z8673 Personal history of transient ischemic attack (TIA), and cerebral infarction without residual deficits: Secondary | ICD-10-CM | POA: Diagnosis not present

## 2017-09-12 DIAGNOSIS — I200 Unstable angina: Secondary | ICD-10-CM

## 2017-09-12 DIAGNOSIS — I5022 Chronic systolic (congestive) heart failure: Secondary | ICD-10-CM | POA: Diagnosis not present

## 2017-09-12 DIAGNOSIS — F0390 Unspecified dementia without behavioral disturbance: Secondary | ICD-10-CM | POA: Diagnosis not present

## 2017-09-12 DIAGNOSIS — N179 Acute kidney failure, unspecified: Secondary | ICD-10-CM | POA: Diagnosis not present

## 2017-09-12 LAB — NM MYOCAR MULTI W/SPECT W/WALL MOTION / EF: Rest HR: 87 {beats}/min

## 2017-09-12 LAB — COMPREHENSIVE METABOLIC PANEL
ALBUMIN: 3.2 g/dL — AB (ref 3.5–5.0)
ALT: 14 U/L (ref 0–44)
AST: 16 U/L (ref 15–41)
Alkaline Phosphatase: 95 U/L (ref 38–126)
Anion gap: 6 (ref 5–15)
BILIRUBIN TOTAL: 0.5 mg/dL (ref 0.3–1.2)
BUN: 11 mg/dL (ref 8–23)
CHLORIDE: 106 mmol/L (ref 98–111)
CO2: 29 mmol/L (ref 22–32)
CREATININE: 1.23 mg/dL (ref 0.61–1.24)
Calcium: 8.7 mg/dL — ABNORMAL LOW (ref 8.9–10.3)
GFR calc Af Amer: >60 mL/min (ref >60)
GFR, EST NON AFRICAN AMERICAN: 59 mL/min — AB (ref >60)
GLUCOSE: 233 mg/dL — AB (ref 70–99)
POTASSIUM: 4.2 mmol/L (ref 3.5–5.1)
Sodium: 141 mmol/L (ref 135–145)
Total Protein: 5.6 g/dL — ABNORMAL LOW (ref 6.5–8.1)

## 2017-09-12 LAB — CBC
HCT: 37.9 % — ABNORMAL LOW (ref 39.0–52.0)
Hemoglobin: 11.6 g/dL — ABNORMAL LOW (ref 13.0–17.0)
MCH: 29 pg (ref 26.0–34.0)
MCHC: 30.6 g/dL (ref 30.0–36.0)
MCV: 94.8 fL (ref 78.0–100.0)
Platelets: 180 K/uL (ref 150–400)
RBC: 4 MIL/uL — ABNORMAL LOW (ref 4.22–5.81)
RDW: 20.9 % — ABNORMAL HIGH (ref 11.5–15.5)
WBC: 4.3 K/uL (ref 4.0–10.5)

## 2017-09-12 LAB — HEMOGLOBIN A1C
Hgb A1c MFr Bld: 7.6 % — ABNORMAL HIGH (ref 4.8–5.6)
Mean Plasma Glucose: 171.42 mg/dL

## 2017-09-12 LAB — GLUCOSE, CAPILLARY
GLUCOSE-CAPILLARY: 112 mg/dL — AB (ref 70–99)
GLUCOSE-CAPILLARY: 198 mg/dL — AB (ref 70–99)
GLUCOSE-CAPILLARY: 154 mg/dL — AB (ref 70–99)
GLUCOSE-CAPILLARY: 194 mg/dL — AB (ref 70–99)

## 2017-09-12 LAB — APTT
APTT: 66 s — AB (ref 24–36)
aPTT: 46 seconds — ABNORMAL HIGH (ref 24–36)

## 2017-09-12 LAB — HEPARIN LEVEL (UNFRACTIONATED)
Heparin Unfractionated: 1.38 [IU]/mL — ABNORMAL HIGH (ref 0.30–0.70)
Heparin Unfractionated: 1.03 IU/mL — ABNORMAL HIGH (ref 0.30–0.70)

## 2017-09-12 MED ORDER — TECHNETIUM TC 99M TETROFOSMIN IV KIT
30.0000 | PACK | Freq: Once | INTRAVENOUS | Status: AC | PRN
  Administered 2017-09-12: 30 via INTRAVENOUS

## 2017-09-12 MED ORDER — REGADENOSON 0.4 MG/5ML IV SOLN
INTRAVENOUS | Status: AC
  Filled 2017-09-12: qty 5

## 2017-09-12 MED ORDER — REGADENOSON 0.4 MG/5ML IV SOLN
0.4000 mg | Freq: Once | INTRAVENOUS | Status: AC
  Administered 2017-09-12: 0.4 mg via INTRAVENOUS
  Filled 2017-09-12: qty 5

## 2017-09-12 MED ORDER — TECHNETIUM TC 99M TETROFOSMIN IV KIT
10.0000 | PACK | Freq: Once | INTRAVENOUS | Status: AC | PRN
  Administered 2017-09-12: 10 via INTRAVENOUS

## 2017-09-12 NOTE — Progress Notes (Signed)
ANTICOAGULATION CONSULT NOTE   Pharmacy Consult for Heparin Indication: chest pain/ACS  No Known Allergies  Patient Measurements: Height: 6' (182.9 cm) Weight: 140 lb 4.8 oz (63.6 kg) IBW/kg (Calculated) : 77.6 Heparin Dosing Weight: 64kg  Vital Signs: Temp: 98.3 F (36.8 C) (07/06 1239) Temp Source: Oral (07/06 1239) BP: 127/78 (07/06 1239) Pulse Rate: 89 (07/06 1239)  Labs: Recent Labs    09/10/17 2238 09/10/17 2316 09/11/17 0244 09/11/17 0726 09/11/17 1248 09/12/17 0240 09/12/17 1230  HGB 11.6*  --   --   --   --  11.6*  --   HCT 37.5*  --   --   --   --  37.9*  --   PLT 177  --   --   --   --  180  --   APTT  --   --   --   --   --  46* 66*  LABPROT  --  15.0  --   --   --   --   --   INR  --  1.19  --   --   --   --   --   HEPARINUNFRC  --   --   --   --   --  1.38* 1.03*  CREATININE 1.44*  --   --  1.28*  --  1.23  --   TROPONINI  --   --  0.03* <0.03 <0.03  --   --     Estimated Creatinine Clearance: 51.7 mL/min (by C-G formula based on SCr of 1.23 mg/dL).   Medical History: Past Medical History:  Diagnosis Date  . Atrial fibrillation (HCC)   . Coronary artery disease   . Diabetes mellitus without complication (HCC)   . Stroke [facility](HCC)     Medications:  Scheduled:  . aspirin  81 mg Oral Daily  . atorvastatin  40 mg Oral q1800  . insulin aspart  0-5 Units Subcutaneous QHS  . insulin aspart  0-9 Units Subcutaneous TID WC  . insulin detemir  8 Units Subcutaneous BID  . mometasone-formoterol  2 puff Inhalation BID  . pneumococcal 23 valent vaccine  0.5 mL Intramuscular Tomorrow-1000  . regadenoson      . sodium chloride flush  3 mL Intravenous Q12H  . tiotropium  18 mcg Inhalation Daily    Assessment: Patient is 67 year old male who presented with left sided chest pain/SOB on 7/4. Patient on apixaban PTA. Pharmacy consulted to transition to heparin infusion. Last dose of apixaban 0834 on 7/5. CBC stable, troponin <0.03, no documented signs or  symptoms of bleeding. Due to timing of recent apixaban dosing, will monitor aPTT and heparin level until correlated.   7/6 PM update: heparin level still elevated at 1.03 as expected due to apixaban use, aPTT is therapeutic at 66. Heparin level and aPTT not yet correlated.   Goal of Therapy:  aPTT 66-102 secs Heparin level 0.3-0.7 units/ml Monitor platelets by anticoagulation protocol: Yes   Plan:  Increase heparin to 1000 units/hr 0500 aPTT/HL  Arvilla MarketMelissa Alyx Mcguirk, PharmD PGY1 Pharmacy Resident Phone [phone] 09/12/2017     2:15 PM

## 2017-09-12 NOTE — Progress Notes (Signed)
[place] TEAM 1 - Stepdown/ICU TEAM  Larry MylarKenneth Leach  [id number] DOB: Aug 17, 1950 DOA: 09/10/2017 PCP: System, Pcp Not In    Brief Narrative:  67 y.o. male with a hx of dementia, stroke, CAD s/p CABG, DM, and paroxysmal atrial fibrillation on Eliquis who presented to the ED w/ chest pain radiating to the left shoulder and neck, associated with mild dyspnea, waxing and waning, but with no alleviating or exacerbating factors identified.   Significant Events: 7/4 admit  Subjective: The patient is resting comfortably in bed.  He reports ongoing intermittent substernal chest pressure.  He denies significant shortness of breath.  He reports a good appetite.  Assessment & Plan:  Unstable Angina - CAD s/p CABG 2000 + PCI 2018 Cardiology following - for cardiac cath early next week   Parox Afib CHADS-VASc is 7 - stopped DOAC and now on heparin gtt to allow for cardiac cath  DM CBG is reasonably well controlled at this time  COPD Well compensated at this time  Hx of CVA  Dementia  Appears mild to moderate - patient has become quite confused at nighttime  Acute renal injury  Creatinine has improved nicely with gentle volume resuscitation  Recent Labs  Lab 09/10/17 2238 09/11/17 0726 09/12/17 0240  CREATININE 1.44* 1.28* 1.23     DVT prophylaxis: IV heparin  Code Status: FULL CODE Family Communication: no family present at time of exam  Disposition Plan: SDU  Consultants:  Cardiology   Antimicrobials:  none   Objective: Blood pressure 122/77, pulse 90, temperature 98.8 F (37.1 C), temperature source Oral, resp. rate 18, height 6' (1.829 m), weight 63.6 kg (140 lb 4.8 oz), SpO2 98 %.  Intake/Output Summary (Last 24 hours) at 09/12/2017 1551 Last data filed at 09/12/2017 1500 Gross per 24 hour  Intake 651.34 ml  Output 1500 ml  Net -848.66 ml   Filed Weights   09/10/17 2227 09/11/17 0154 09/12/17 0411  Weight: 59 kg (130 lb) 64 kg (141 lb) 63.6 kg (140 lb 4.8  oz)    Examination: General: No acute respiratory distress Lungs: CTA B - no wheezing or crackles  Cardiovascular: RRR - no M or rub  Abdomen: NT/ND, soft, bs+, no mass  Extremities: No signif edema B LE   CBC: Recent Labs  Lab 09/10/17 2238 09/12/17 0240  WBC 5.8 4.3  HGB 11.6* 11.6*  HCT 37.5* 37.9*  MCV 94.5 94.8  PLT 177 180   Basic Metabolic Panel: Recent Labs  Lab 09/10/17 2238 09/11/17 0726 09/12/17 0240  NA 136 144 141  K 4.3 4.0 4.2  CL 97* 105 106  CO2 28 30 29   GLUCOSE 260* 85 233*  BUN 14 12 11   CREATININE 1.44* 1.28* 1.23  CALCIUM 8.8* 8.8* 8.7*   GFR: Estimated Creatinine Clearance: 51.7 mL/min (by C-G formula based on SCr of 1.23 mg/dL).  Liver Function Tests: Recent Labs  Lab 09/12/17 0240  AST 16  ALT 14  ALKPHOS 95  BILITOT 0.5  PROT 5.6*  ALBUMIN 3.2*    Coagulation Profile: Recent Labs  Lab 09/10/17 2316  INR 1.19    Cardiac Enzymes: Recent Labs  Lab 09/11/17 0244 09/11/17 0726 09/11/17 1248  TROPONINI 0.03* <0.03 <0.03    HbA1C: Hgb A1c MFr Bld  Date/Time Value Ref Range Status  09/12/2017 02:40 AM 7.6 (H) 4.8 - 5.6 % Final    Comment:    (NOTE) Pre diabetes:          5.7%-6.4% Diabetes:              >  6.4% Glycemic control for   <7.0% adults with diabetes     CBG: Recent Labs  Lab 09/11/17 1607 09/11/17 2116 09/12/17 0733 09/12/17 1153 09/12/17 1542  GLUCAP 213* 237* 112* 198* 154*    Recent Results (from the past 240 hour(s))  MRSA PCR Screening     Status: None   Collection Time: 09/11/17  1:51 AM  Result Value Ref Range Status   MRSA by PCR NEGATIVE NEGATIVE Final    Comment:        The GeneXpert MRSA Assay (FDA approved for NASAL specimens only), is one component of a comprehensive MRSA colonization surveillance program. It is not intended to diagnose MRSA infection nor to guide or monitor treatment for MRSA infections. Performed at [facility], [address], [place],  [place] [address]      Scheduled Meds: . aspirin  81 mg Oral Daily  . atorvastatin  40 mg Oral q1800  . insulin aspart  0-5 Units Subcutaneous QHS  . insulin aspart  0-9 Units Subcutaneous TID WC  . insulin detemir  8 Units Subcutaneous BID  . mometasone-formoterol  2 puff Inhalation BID  . pneumococcal 23 valent vaccine  0.5 mL Intramuscular Tomorrow-1000  . regadenoson      . sodium chloride flush  3 mL Intravenous Q12H  . tiotropium  18 mcg Inhalation Daily   Continuous Infusions: . sodium chloride    . heparin 1,000 Units/hr (09/12/17 1432)     LOS: 1 day   Lonia Blood, MD [facility] Office  [phone] Pager - Text Page per Amion as per below:  On-Call/Text Page:      [email or web address]      password [email or web address]  If 7PM-7AM, please contact night-coverage [email or web address] Password [email or web address] 09/12/2017, 3:51 PM

## 2017-09-12 NOTE — Progress Notes (Signed)
  Lexiscan Myoview Pharmacologic stress portion completed. Images are currently pending. Signed,  Tereso NewcomerScott Dashayla Theissen, PA-C   09/12/2017 10:46 AM

## 2017-09-12 NOTE — Progress Notes (Signed)
ANTICOAGULATION CONSULT NOTE   Pharmacy Consult for Heparin Indication: chest pain/ACS  No Known Allergies  Patient Measurements: Height: 6' (182.9 cm) Weight: 141 lb (64 kg) IBW/kg (Calculated) : 77.6 Heparin Dosing Weight: 64kg  Vital Signs: Temp: 98.8 F (37.1 C) (07/06 0010) Temp Source: Oral (07/06 0010) BP: 127/54 (07/06 0010) Pulse Rate: 90 (07/06 0010)  Labs: Recent Labs    09/10/17 2238 09/10/17 2316 09/11/17 0244 09/11/17 0726 09/11/17 1248 09/12/17 0240  HGB 11.6*  --   --   --   --  11.6*  HCT 37.5*  --   --   --   --  37.9*  PLT 177  --   --   --   --  180  APTT  --   --   --   --   --  46*  LABPROT  --  15.0  --   --   --   --   INR  --  1.19  --   --   --   --   HEPARINUNFRC  --   --   --   --   --  1.38*  CREATININE 1.44*  --   --  1.28*  --  1.23  TROPONINI  --   --  0.03* <0.03 <0.03  --     Estimated Creatinine Clearance: 52 mL/min (by C-G formula based on SCr of 1.23 mg/dL).   Medical History: Past Medical History:  Diagnosis Date  . Atrial fibrillation (HCC)   . Coronary artery disease   . Diabetes mellitus without complication (HCC)   . Stroke [facility](HCC)     Medications:  Scheduled:  . aspirin  81 mg Oral Daily  . atorvastatin  40 mg Oral q1800  . insulin aspart  0-5 Units Subcutaneous QHS  . insulin aspart  0-9 Units Subcutaneous TID WC  . insulin detemir  8 Units Subcutaneous BID  . mometasone-formoterol  2 puff Inhalation BID  . pneumococcal 23 valent vaccine  0.5 mL Intramuscular Tomorrow-1000  . sodium chloride flush  3 mL Intravenous Q12H  . tiotropium  18 mcg Inhalation Daily    Assessment: Patient is 67 year old male who presented with left sided chest pain / SOB to [facility] on 7/4. Patient on apixaban PTA. Pharmacy consulted to transition to heparin infusion. Last dose of apixaban 0834 on 7/5. Hgb 11.6, Plts 177, troponin 0.03, no documented signs or symptoms of bleeding. Due to timing of recent apixaban dosing, will monitor aPTT  and heparin level until correlated.   7/6 AM update: heparin level elevated as expected due to apixaban use, aPTT is SUB-therapeutic, no issues per RN.   Goal of Therapy:  APTT 66-102 secs Heparin level 0.3-0.7 units/ml Monitor platelets by anticoagulation protocol: Yes   Plan:  Inc heparin to 950 units/hr 1200 aPTT/HL  Larry Leach, PharmD, BCPS Clinical Pharmacist Phone: [phone]

## 2017-09-13 DIAGNOSIS — I200 Unstable angina: Secondary | ICD-10-CM | POA: Diagnosis not present

## 2017-09-13 DIAGNOSIS — E119 Type 2 diabetes mellitus without complications: Secondary | ICD-10-CM | POA: Diagnosis not present

## 2017-09-13 DIAGNOSIS — I480 Paroxysmal atrial fibrillation: Secondary | ICD-10-CM | POA: Diagnosis not present

## 2017-09-13 DIAGNOSIS — I25110 Atherosclerotic heart disease of native coronary artery with unstable angina pectoris: Secondary | ICD-10-CM | POA: Diagnosis not present

## 2017-09-13 DIAGNOSIS — Z7901 Long term (current) use of anticoagulants: Secondary | ICD-10-CM

## 2017-09-13 DIAGNOSIS — R079 Chest pain, unspecified: Secondary | ICD-10-CM | POA: Diagnosis not present

## 2017-09-13 DIAGNOSIS — I25700 Atherosclerosis of coronary artery bypass graft(s), unspecified, with unstable angina pectoris: Secondary | ICD-10-CM | POA: Diagnosis not present

## 2017-09-13 LAB — CBC
HCT: 38.5 % — ABNORMAL LOW (ref 39.0–52.0)
Hemoglobin: 11.8 g/dL — ABNORMAL LOW (ref 13.0–17.0)
MCH: 29.1 pg (ref 26.0–34.0)
MCHC: 30.6 g/dL (ref 30.0–36.0)
MCV: 95.1 fL (ref 78.0–100.0)
Platelets: 200 10*3/uL (ref 150–400)
RBC: 4.05 MIL/uL — AB (ref 4.22–5.81)
RDW: 21 % — ABNORMAL HIGH (ref 11.5–15.5)
WBC: 4.7 10*3/uL (ref 4.0–10.5)

## 2017-09-13 LAB — GLUCOSE, CAPILLARY
GLUCOSE-CAPILLARY: 102 mg/dL — AB (ref 70–99)
GLUCOSE-CAPILLARY: 150 mg/dL — AB (ref 70–99)
GLUCOSE-CAPILLARY: 149 mg/dL — AB (ref 70–99)
GLUCOSE-CAPILLARY: 177 mg/dL — AB (ref 70–99)

## 2017-09-13 LAB — BASIC METABOLIC PANEL
ANION GAP: 5 (ref 5–15)
BUN: 12 mg/dL (ref 8–23)
CALCIUM: 8.6 mg/dL — AB (ref 8.9–10.3)
CHLORIDE: 110 mmol/L (ref 98–111)
CO2: 28 mmol/L (ref 22–32)
Creatinine, Ser: 1.06 mg/dL (ref 0.61–1.24)
GFR calc non Af Amer: >60 mL/min (ref >60)
Glucose, Bld: 100 mg/dL — ABNORMAL HIGH (ref 70–99)
Potassium: 3.9 mmol/L (ref 3.5–5.1)
SODIUM: 143 mmol/L (ref 135–145)

## 2017-09-13 LAB — HEPARIN LEVEL (UNFRACTIONATED): HEPARIN UNFRACTIONATED: 0.45 [IU]/mL (ref 0.30–0.70)

## 2017-09-13 LAB — APTT: aPTT: 72 seconds — ABNORMAL HIGH (ref 24–36)

## 2017-09-13 MED ORDER — SODIUM CHLORIDE 0.9% FLUSH: 3.0000 mL | Freq: Two times a day (BID) | INTRAVENOUS | Status: DC

## 2017-09-13 MED ORDER — SODIUM CHLORIDE 0.9% FLUSH: 3.0000 mL | INTRAVENOUS | Status: DC | PRN

## 2017-09-13 MED ORDER — ASPIRIN 81 MG PO CHEW
81.0000 mg | CHEWABLE_TABLET | ORAL | Status: AC
  Administered 2017-09-14: 81 mg via ORAL
  Filled 2017-09-13: qty 1

## 2017-09-13 MED ORDER — SODIUM CHLORIDE 0.9 % WEIGHT BASED INFUSION: 1.0000 mL/kg/h | INTRAVENOUS | Status: DC

## 2017-09-13 MED ORDER — SODIUM CHLORIDE 0.9 % WEIGHT BASED INFUSION
3.0000 mL/kg/h | INTRAVENOUS | Status: AC
  Administered 2017-09-14: 3 mL/kg/h via INTRAVENOUS

## 2017-09-13 MED ORDER — SODIUM CHLORIDE 0.9 % IV SOLN: 250.0000 mL | INTRAVENOUS | Status: DC | PRN

## 2017-09-13 NOTE — Progress Notes (Signed)
ANTICOAGULATION CONSULT NOTE   Pharmacy Consult for Heparin Indication: chest pain/ACS  No Known Allergies  Patient Measurements: Height: 6' (182.9 cm) Weight: 140 lb 3.2 oz (63.6 kg) IBW/kg (Calculated) : 77.6 Heparin Dosing Weight: 64kg  Vital Signs: Temp: 98.4 F (36.9 C) (07/07 1139) Temp Source: Oral (07/07 1139) BP: 133/72 (07/07 1139) Pulse Rate: 91 (07/07 1139)  Labs: Recent Labs    09/10/17 2238 09/10/17 2316 09/11/17 0244 09/11/17 0726 09/11/17 1248 09/12/17 0240 09/12/17 1230 09/13/17 0614  HGB 11.6*  --   --   --   --  11.6*  --  11.8*  HCT 37.5*  --   --   --   --  37.9*  --  38.5*  PLT 177  --   --   --   --  180  --  200  APTT  --   --   --   --   --  46* 66* 72*  LABPROT  --  15.0  --   --   --   --   --   --   INR  --  1.19  --   --   --   --   --   --   HEPARINUNFRC  --   --   --   --   --  1.38* 1.03* 0.45  CREATININE 1.44*  --   --  1.28*  --  1.23  --  1.06  TROPONINI  --   --  0.03* <0.03 <0.03  --   --   --     Estimated Creatinine Clearance: 60 mL/min (by C-G formula based on SCr of 1.06 mg/dL).   Medical History: Past Medical History:  Diagnosis Date  . Atrial fibrillation (HCC)   . Coronary artery disease   . Diabetes mellitus without complication (HCC)   . Stroke [facility](HCC)     Medications:  Scheduled:  . aspirin  81 mg Oral Daily  . atorvastatin  40 mg Oral q1800  . insulin aspart  0-5 Units Subcutaneous QHS  . insulin aspart  0-9 Units Subcutaneous TID WC  . insulin detemir  8 Units Subcutaneous BID  . mometasone-formoterol  2 puff Inhalation BID  . pneumococcal 23 valent vaccine  0.5 mL Intramuscular Tomorrow-1000  . sodium chloride flush  3 mL Intravenous Q12H  . tiotropium  18 mcg Inhalation Daily    Assessment: Patient is 67 year old male who presented with left sided chest pain/SOB on 7/4. Patient on apixaban PTA. Pharmacy consulted to transition to heparin infusion. Last dose of apixaban 0834 on 7/5. CBC stable,  troponin <0.03, no documented signs or symptoms of bleeding. Due to timing of recent apixaban dosing, will monitor aPTT and heparin level until correlated.   7/7 update: heparin level within goal at 0.45, aPTT is therapeutic at 72. Heparin level and aPTT are correlated. No need to monitor aPTT further.  Goal of Therapy:  Heparin level 0.3-0.7 units/ml Monitor platelets by anticoagulation protocol: Yes   Plan:  Continue heparin to 1000 units/hr Daily HL and CBC  Arvilla MarketMelissa Lore, PharmD PGY1 Pharmacy Resident Phone [phone] 09/13/2017     12:57 PM

## 2017-09-13 NOTE — Progress Notes (Signed)
Progress Note  Patient Name: Larry Leach Date of Encounter: 09/13/2017  Primary Cardiologist: No primary care provider on file.   Subjective   The patient continues to have retrosternal pressure-like chest pain radiating to his left arm and neck.  Inpatient Medications    Scheduled Meds: . aspirin  81 mg Oral Daily  . atorvastatin  40 mg Oral q1800  . insulin aspart  0-5 Units Subcutaneous QHS  . insulin aspart  0-9 Units Subcutaneous TID WC  . insulin detemir  8 Units Subcutaneous BID  . mometasone-formoterol  2 puff Inhalation BID  . pneumococcal 23 valent vaccine  0.5 mL Intramuscular Tomorrow-1000  . sodium chloride flush  3 mL Intravenous Q12H  . tiotropium  18 mcg Inhalation Daily   Continuous Infusions: . sodium chloride    . heparin 1,000 Units/hr (09/13/17 0000)   PRN Meds: sodium chloride, acetaminophen, ALPRAZolam, nitroGLYCERIN, ondansetron (ZOFRAN) IV, oxyCODONE-acetaminophen, sodium chloride flush   Vital Signs    Vitals:   09/12/17 2359 09/13/17 0505 09/13/17 0746 09/13/17 0918  BP: 132/89 135/81 127/85   Pulse: 86 86 90   Resp: 16 16 20    Temp: 98.9 F (37.2 C) 98 F (36.7 C) 97.9 F (36.6 C)   TempSrc: Oral Oral Oral   SpO2: 97% 97% 95% 94%  Weight:  140 lb 3.2 oz (63.6 kg)    Height:        Intake/Output Summary (Last 24 hours) at 09/13/2017 1114 Last data filed at 09/13/2017 0519 Gross per 24 hour  Intake 671.32 ml  Output 1240 ml  Net -568.68 ml   Filed Weights   09/11/17 0154 09/12/17 0411 09/13/17 0505  Weight: 141 lb (64 kg) 140 lb 4.8 oz (63.6 kg) 140 lb 3.2 oz (63.6 kg)    Telemetry    Atrial paced rhythm with very frequent PVCs- Personally Reviewed  ECG    Atrial paced rhythm- Personally Reviewed  Physical Exam  Somnolent GEN: No acute distress.   Neck: No JVD Cardiac: RRR, no murmurs, rubs, or gallops.  Respiratory: Clear to auscultation bilaterally. GI: Soft, nontender, non-distended  MS: No edema; No  deformity. Neuro:  Nonfocal  Psych: Normal affect   Labs    Chemistry Recent Labs  Lab 09/11/17 0726 09/12/17 0240 09/13/17 0614  NA 144 141 143  K 4.0 4.2 3.9  CL 105 106 110  CO2 30 29 28   GLUCOSE 85 233* 100*  BUN 12 11 12   CREATININE 1.28* 1.23 1.06  CALCIUM 8.8* 8.7* 8.6*  PROT  --  5.6*  --   ALBUMIN  --  3.2*  --   AST  --  16  --   ALT  --  14  --   ALKPHOS  --  95  --   BILITOT  --  0.5  --   GFRNONAA 56* 59* >60  GFRAA >60 >60 >60  ANIONGAP 9 6 5      Hematology Recent Labs  Lab 09/10/17 2238 09/12/17 0240 09/13/17 0614  WBC 5.8 4.3 4.7  RBC 3.97* 4.00* 4.05*  HGB 11.6* 11.6* 11.8*  HCT 37.5* 37.9* 38.5*  MCV 94.5 94.8 95.1  MCH 29.2 29.0 29.1  MCHC 30.9 30.6 30.6  RDW 20.6* 20.9* 21.0*  PLT 177 180 200    Cardiac Enzymes Recent Labs  Lab 09/11/17 0244 09/11/17 0726 09/11/17 1248  TROPONINI 0.03* <0.03 <0.03    Recent Labs  Lab 09/10/17 2320  TROPIPOC 0.00     BNPNo results  for input(s): BNP, PROBNP in the last 168 hours.   DDimer No results for input(s): DDIMER in the last 168 hours.   Radiology    Nm Myocar Multi W/spect W/wall Motion / Ef  Result Date: 09/12/2017  Diffuse nonspecific T wave abnormalities.  Defect 1: There is a large defect of severe severity present in the basal inferoseptal, basal inferior, basal inferolateral, mid inferior, mid inferolateral and apical inferior location.  Findings consistent with prior myocardial infarction. There are no significant ischemic territories.  This is a high risk study based on severely reduced LVEF and extent of myocardial scar.  Nuclear stress EF: 15%.     Cardiac Studies   Pharmacologic nuclear stress test; 09/12/2017   Diffuse nonspecific T wave abnormalities.  Defect 1: There is a large defect of severe severity present in the basal inferoseptal, basal inferior, basal inferolateral, mid inferior, mid inferolateral and apical inferior location.  Findings consistent with  prior myocardial infarction. There are no significant ischemic territories.  This is a high risk study based on severely reduced LVEF and extent of myocardial scar.  Nuclear stress EF: 15%.    Patient Profile     67 y.o. male   Assessment & Plan    1. CP in patient with CAD s/p CABG: p/w CP at rest with associated radiation of pain to L neck/shoulder, SOB, and nausea. Reports symptoms feel similar to prior MI's.  Per Dr. Royann Shivers my impression this is consistent with unstable angina. Trop trend 0.01> 0.03> <0.03. EKG with atrial paced rhythm.  EKG nondiagnostic secondary to underlying paced rhythm,  last ischemic evaluation was a LHC early 2018 with stent placement to unknown vessel. Last echo presumably at the same time and patient reports a low EF (20-30%?). Trop trend is not consistent with ACS, however he has not had issues with CP since stent placement in 2018 making presentation c/f unstable angina.   Records from [place] are still pending.  He is on eliquis with last dose on 09/11/17. - For some reason he underwent pharmacologic nuclear stress test yesterday that showed large scar in the basal and mid inferior and inferolateral walls as well as anterior inferior wall with no reversibility = no ischemia - Continue ASA - will reduce dose to 81mg  daily to minimize bleeding risk given need for anticoagulation given Afib history -He is on heparin drip- Would benefit from the addition of a BBlocker +/- ACEi/ARB if BP will allow - reportedly intolerant to BBlockers in the past due to hypotension  Despite no ischemia on nuclear stress test, the patient has ongoing chest pain typical for his angina presentation, we will plan for cardiac catheterization tomorrow.  His LVEF decreased to 15%.  2. HTN: BP stable, not on medications - Continue to monitor  3. 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 On po lasix 20mg  BID at home; on hold for possible  AKI - Cr 1.44 on admission, improved to 1.28 today. Appears euvolemic on exam today - Resume home lasix once Cr improved.  - Would benefit from the addition of a BBlocker +/- ACEi/ARB if BP will allow  4. Atrial fibrillation: atrial paced on telemetry/EKGs.  - Continue anticoagulation for CHA2DS2-VASc Score of 7  (CHF, HTN, DM, Vascular history, Age 40-75. And CVA history) - Will transition to a heparin gtt in anticipation of likely LHC   5. HLD: no lipids on file - Continue statin  6. DM type 2: on insulin BID;  goal A1C <7 - Continue management per primary team  7. COPD: has chronic baseline SOB and cough; no recent change. Some scattered wheezing on exam today.  - Continue management per primary team  For questions or updates, please contact [facility] Please consult [email or web address] for contact info under Cardiology/STEMI.     Signed, Tobias Alexander, MD  09/13/2017, 11:14 AM

## 2017-09-13 NOTE — Progress Notes (Signed)
PROGRESS NOTE    Larry MylarKenneth Leach  XBM:[id number] DOB: 06/12/1950 DOA: 09/10/2017 PCP: System, Pcp Not In    Brief Narrative:  67 year old male who presented with chest pain.  He does have the significant past medical history for dementia, history of CVA, coronary artery disease status post bypass grafting, type 2 diabetes mellitus and paroxysmal atrial relation.  [place], he developed acute precordial chest pain while sitting, moderate in intensity, radiated to the left shoulder/neck, associated with dyspnea and mild nausea.  Initial physical examination blood pressure 143/82, heart rate 85, respiratory 20, temperature 97.7, oxygen saturation 100%.  Moist mucous membranes, breath sounds diminished bilaterally, no wheezing, rales or rhonchi, heart sounds S1-S2  present and rhythmic, no gallops, rubs or murmurs, abdomen soft nontender, lower extremity edema.  Sodium 144, potassium 4.0, chloride 1 5, bicarb 30, glucose 85, creatinine 1.28, BUN 12, troponin I less than 0.03.  Chest x-ray with no infiltrates, defibrillator in place.  EKG with a pacing, left bundle branch block.   Patient was admitted to the hospital with diagnosis of chest pain to rule out acute coronary syndrome.  Assessment & Plan:   Principal Problem:   Chest pain Active Problems:   Coronary artery disease   Diabetes mellitus without complication (HCC)   History of completed stroke   Dementia   Mild renal insufficiency   COPD (chronic obstructive pulmonary disease) (HCC)   PAF (paroxysmal atrial fibrillation) (HCC)   Unstable angina (HCC)   1. Angina chest pain. Abnormal stress test with high risk due to large defect in the basal inferiorseptal, basal inferior, mid inferior, mid inferiorlateral, and apical inferior. Ejection fraction with significant reduction down to 15%. Patient will go for cardiac catheterization in am. Patient has remained chest pain free.   2. Systolic heart failure  with ischemic cardiomyopathy/ sp CABG and angioplasty, sp AICD. Clinically euvolemic, will continue medical therapy with aspirin and atorvastatin. IV heparin pre-cardiac catheterization. As needed nitroglycerin. Patient not on b blocker.   3. Chronic atrial fibrillation. Very poor LV systolic function, will continue rate controlled, has remained a paced on the telemetry monitor.   4, Pre-renal AKI. Renal function has improved, will continue to follow on renal panel in am.   5. T2DM. Will continue capillary glucose monitoring and coverage with insulin sliding scale. Basal insulin with levemir 8 units bid.   6. COPD. Stable with no signs of exacerbation. Continue tiotropium.    DVT prophylaxis: heparin   Code Status: full Family Communication: I spoke with patient's family at the bedside and all questions were addressed.  Disposition Plan/ discharge barriers: pending cardiac workup.    Consultants:   Cardiology   Procedures:     Antimicrobials:       Subjective: Patient is feel;ing well, no further chest pain, no dyspnea, no nausea or vomiting, has been out of bed to the restroom.   Objective: Vitals:   09/13/17 0505 09/13/17 0746 09/13/17 0918 09/13/17 1139  BP: 135/81 127/85  133/72  Pulse: 86 90  91  Resp: 16 20  20   Temp: 98 F (36.7 C) 97.9 F (36.6 C)  98.4 F (36.9 C)  TempSrc: Oral Oral  Oral  SpO2: 97% 95% 94% 94%  Weight: 63.6 kg (140 lb 3.2 oz)     Height:        Intake/Output Summary (Last 24 hours) at 09/13/2017 1259 Last data filed at 09/13/2017 1200 Gross per 24 hour  Intake 1151.32 ml  Output 1540 ml  Net -388.68 ml   Filed Weights   09/11/17 0154 09/12/17 0411 09/13/17 0505  Weight: 64 kg (141 lb) 63.6 kg (140 lb 4.8 oz) 63.6 kg (140 lb 3.2 oz)    Examination:   General: Not in pain or dyspnea, deconditioned  Neurology: Awake and alert, non focal  E ENT: mild pallor, no icterus, oral mucosa moist Cardiovascular: No JVD. S1-S2 present,  rhythmic, no gallops, rubs, or murmurs. No lower extremity edema. Pulmonary: positive breath sounds bilaterally, adequate air movement, no wheezing, rhonchi or rales. Gastrointestinal. Abdomen with, no organomegaly, non tender, no rebound or guarding Skin. No rashes Musculoskeletal: no joint deformities     Data Reviewed: I have personally reviewed following labs and imaging studies  CBC: Recent Labs  Lab 09/10/17 2238 09/12/17 0240 09/13/17 0614  WBC 5.8 4.3 4.7  HGB 11.6* 11.6* 11.8*  HCT 37.5* 37.9* 38.5*  MCV 94.5 94.8 95.1  PLT 177 180 200   Basic Metabolic Panel: Recent Labs  Lab 09/10/17 2238 09/11/17 0726 09/12/17 0240 09/13/17 0614  NA 136 144 141 143  K 4.3 4.0 4.2 3.9  CL 97* 105 106 110  CO2 28 30 29 28   GLUCOSE 260* 85 233* 100*  BUN 14 12 11 12   CREATININE 1.44* 1.28* 1.23 1.06  CALCIUM 8.8* 8.8* 8.7* 8.6*   GFR: Estimated Creatinine Clearance: 60 mL/min (by C-G formula based on SCr of 1.06 mg/dL). Liver Function Tests: Recent Labs  Lab 09/12/17 0240  AST 16  ALT 14  ALKPHOS 95  BILITOT 0.5  PROT 5.6*  ALBUMIN 3.2*   No results for input(s): LIPASE, AMYLASE in the last 168 hours. No results for input(s): AMMONIA in the last 168 hours. Coagulation Profile: Recent Labs  Lab 09/10/17 2316  INR 1.19   Cardiac Enzymes: Recent Labs  Lab 09/11/17 0244 09/11/17 0726 09/11/17 1248  TROPONINI 0.03* <0.03 <0.03   BNP (last 3 results) No results for input(s): PROBNP in the last 8760 hours. HbA1C: Recent Labs    09/12/17 0240  HGBA1C 7.6*   CBG: Recent Labs  Lab 09/12/17 1153 09/12/17 1542 09/12/17 2058 09/13/17 0745 09/13/17 1137  GLUCAP 198* 154* 194* 102* 150*   Lipid Profile: No results for input(s): CHOL, HDL, LDLCALC, TRIG, CHOLHDL, LDLDIRECT in the last 72 hours. Thyroid Function Tests: No results for input(s): TSH, T4TOTAL, FREET4, T3FREE, THYROIDAB in the last 72 hours. Anemia Panel: No results for input(s):  VITAMINB12, FOLATE, FERRITIN, TIBC, IRON, RETICCTPCT in the last 72 hours.    Radiology Studies: I have reviewed all of the imaging during this hospital visit personally     Scheduled Meds: . aspirin  81 mg Oral Daily  . atorvastatin  40 mg Oral q1800  . insulin aspart  0-5 Units Subcutaneous QHS  . insulin aspart  0-9 Units Subcutaneous TID WC  . insulin detemir  8 Units Subcutaneous BID  . mometasone-formoterol  2 puff Inhalation BID  . pneumococcal 23 valent vaccine  0.5 mL Intramuscular Tomorrow-1000  . sodium chloride flush  3 mL Intravenous Q12H  . tiotropium  18 mcg Inhalation Daily   Continuous Infusions: . sodium chloride    . heparin 1,000 Units/hr (09/13/17 0000)     LOS: 2 days        Nikita Humble Annett Gula, MD [facility] Pager [phone]

## 2017-09-14 ENCOUNTER — Encounter (HOSPITAL_COMMUNITY)
Admission: EM | Discharge status: Against Medical Advice | Payer: Self-pay | Source: Home / Self Care | Attending: Internal Medicine

## 2017-09-14 DIAGNOSIS — I255 Ischemic cardiomyopathy: Secondary | ICD-10-CM

## 2017-09-14 DIAGNOSIS — N289 Disorder of kidney and ureter, unspecified: Secondary | ICD-10-CM | POA: Diagnosis not present

## 2017-09-14 DIAGNOSIS — I480 Paroxysmal atrial fibrillation: Secondary | ICD-10-CM | POA: Diagnosis not present

## 2017-09-14 DIAGNOSIS — I25110 Atherosclerotic heart disease of native coronary artery with unstable angina pectoris: Secondary | ICD-10-CM | POA: Diagnosis not present

## 2017-09-14 DIAGNOSIS — R079 Chest pain, unspecified: Secondary | ICD-10-CM | POA: Diagnosis not present

## 2017-09-14 DIAGNOSIS — I200 Unstable angina: Secondary | ICD-10-CM | POA: Diagnosis not present

## 2017-09-14 DIAGNOSIS — I25700 Atherosclerosis of coronary artery bypass graft(s), unspecified, with unstable angina pectoris: Secondary | ICD-10-CM | POA: Diagnosis not present

## 2017-09-14 LAB — BASIC METABOLIC PANEL
ANION GAP: 7 (ref 5–15)
BUN: 10 mg/dL (ref 8–23)
CHLORIDE: 107 mmol/L (ref 98–111)
CO2: 26 mmol/L (ref 22–32)
Calcium: 8.5 mg/dL — ABNORMAL LOW (ref 8.9–10.3)
Creatinine, Ser: 0.98 mg/dL (ref 0.61–1.24)
GFR calc non Af Amer: >60 mL/min (ref >60)
Glucose, Bld: 196 mg/dL — ABNORMAL HIGH (ref 70–99)
POTASSIUM: 3.9 mmol/L (ref 3.5–5.1)
Sodium: 140 mmol/L (ref 135–145)

## 2017-09-14 LAB — CBC
HEMATOCRIT: 38.2 % — AB (ref 39.0–52.0)
HEMOGLOBIN: 12 g/dL — AB (ref 13.0–17.0)
MCH: 29.1 pg (ref 26.0–34.0)
MCHC: 31.4 g/dL (ref 30.0–36.0)
MCV: 92.7 fL (ref 78.0–100.0)
Platelets: 209 10*3/uL (ref 150–400)
RBC: 4.12 MIL/uL — ABNORMAL LOW (ref 4.22–5.81)
RDW: 20.5 % — ABNORMAL HIGH (ref 11.5–15.5)
WBC: 4.8 10*3/uL (ref 4.0–10.5)

## 2017-09-14 LAB — HEPARIN LEVEL (UNFRACTIONATED): HEPARIN UNFRACTIONATED: 0.26 [IU]/mL — AB (ref 0.30–0.70)

## 2017-09-14 LAB — GLUCOSE, CAPILLARY: Glucose-Capillary: 114 mg/dL — ABNORMAL HIGH (ref 70–99)

## 2017-09-14 SURGERY — LEFT HEART CATH AND CORONARY ANGIOGRAPHY
Anesthesia: LOCAL

## 2017-09-14 MED ORDER — NITROGLYCERIN 0.4 MG SL SUBL: 0.4000 mg | SUBLINGUAL_TABLET | SUBLINGUAL | 0 refills | Status: AC | PRN

## 2017-09-14 MED ORDER — OXYCODONE-ACETAMINOPHEN 5-325 MG PO TABS: 1.0000 | ORAL_TABLET | Freq: Three times a day (TID) | ORAL | 0 refills | Status: DC | PRN

## 2017-09-14 MED ORDER — OXYCODONE-ACETAMINOPHEN 5-325 MG PO TABS: 1.0000 | ORAL_TABLET | Freq: Three times a day (TID) | ORAL | 0 refills | Status: AC | PRN

## 2017-09-14 MED ORDER — ASPIRIN 81 MG PO CHEW: 81.0000 mg | CHEWABLE_TABLET | Freq: Every day | ORAL | 0 refills | Status: AC

## 2017-09-14 MED ORDER — NITROGLYCERIN 0.4 MG SL SUBL: 0.4000 mg | SUBLINGUAL_TABLET | SUBLINGUAL | 0 refills | Status: DC | PRN

## 2017-09-14 MED ORDER — ASPIRIN 81 MG PO CHEW: 81.0000 mg | CHEWABLE_TABLET | Freq: Every day | ORAL | 0 refills | Status: DC

## 2017-09-14 MED FILL — NITROGLYCERIN 0.4 MG TAB SL: 0.4 | 30 days supply | Qty: 25 | Fill #0

## 2017-09-14 MED FILL — OXYCODONE-ACETAMINOPHEN 5-3: 5-325 | 2 days supply | Qty: 5 | Fill #0

## 2017-09-14 NOTE — Progress Notes (Addendum)
Progress Note  Patient Name: Larry Leach Date of Encounter: 09/14/2017  Primary Cardiologist: New - travelled from [place]  Subjective   Last episode of chest pain was last night, lasted 3 min. Find out his brother died last weekend, wish to go home without cath today.  Inpatient Medications    Scheduled Meds: . aspirin  81 mg Oral Daily  . atorvastatin  40 mg Oral q1800  . insulin aspart  0-5 Units Subcutaneous QHS  . insulin aspart  0-9 Units Subcutaneous TID WC  . insulin detemir  8 Units Subcutaneous BID  . mometasone-formoterol  2 puff Inhalation BID  . pneumococcal 23 valent vaccine  0.5 mL Intramuscular Tomorrow-1000  . sodium chloride flush  3 mL Intravenous Q12H  . sodium chloride flush  3 mL Intravenous Q12H  . tiotropium  18 mcg Inhalation Daily   Continuous Infusions: . sodium chloride    . sodium chloride    . sodium chloride 1 mL/kg/hr (09/14/17 0700)  . heparin 1,000 Units/hr (09/13/17 2130)   PRN Meds: sodium chloride, sodium chloride, acetaminophen, ALPRAZolam, nitroGLYCERIN, ondansetron (ZOFRAN) IV, oxyCODONE-acetaminophen, sodium chloride flush, sodium chloride flush   Vital Signs    Vitals:   09/13/17 0918 09/13/17 1139 09/13/17 2002 09/14/17 0445  BP:  133/72 123/78 111/73  Pulse:  91 91 89  Resp:  20    Temp:  98.4 F (36.9 C) 98.6 F (37 C) 98.1 F (36.7 C)  TempSrc:  Oral Oral Oral  SpO2: 94% 94% 98% 98%  Weight:    138 lb 6.4 oz (62.8 kg)  Height:        Intake/Output Summary (Last 24 hours) at 09/14/2017 0830 Last data filed at 09/14/2017 0450 Gross per 24 hour  Intake 1280.09 ml  Output 1100 ml  Net 180.09 ml   Filed Weights   09/12/17 0411 09/13/17 0505 09/14/17 0445  Weight: 140 lb 4.8 oz (63.6 kg) 140 lb 3.2 oz (63.6 kg) 138 lb 6.4 oz (62.8 kg)    Telemetry    Pace rhythm - Personally Reviewed  ECG    Paced rhythm - Personally Reviewed  Physical Exam   GEN: No acute distress.   Neck: No JVD Cardiac: RRR, no  murmurs, rubs, or gallops. Pacemaker in the left pectoral space Respiratory: Clear to auscultation bilaterally. GI: Soft, nontender, non-distended  MS: No edema; No deformity. Neuro:  Nonfocal  Psych: Normal affect   Labs    Chemistry Recent Labs  Lab 09/12/17 0240 09/13/17 0614 09/14/17 0352  NA 141 143 140  K 4.2 3.9 3.9  CL 106 110 107  CO2 29 28 26   GLUCOSE 233* 100* 196*  BUN 11 12 10   CREATININE 1.23 1.06 0.98  CALCIUM 8.7* 8.6* 8.5*  PROT 5.6*  --   --   ALBUMIN 3.2*  --   --   AST 16  --   --   ALT 14  --   --   ALKPHOS 95  --   --   BILITOT 0.5  --   --   GFRNONAA 59* >60 >60  GFRAA >60 >60 >60  ANIONGAP 6 5 7      Hematology Recent Labs  Lab 09/12/17 0240 09/13/17 0614 09/14/17 0352  WBC 4.3 4.7 4.8  RBC 4.00* 4.05* 4.12*  HGB 11.6* 11.8* 12.0*  HCT 37.9* 38.5* 38.2*  MCV 94.8 95.1 92.7  MCH 29.0 29.1 29.1  MCHC 30.6 30.6 31.4  RDW 20.9* 21.0* 20.5*  PLT 180 200  209    Cardiac Enzymes Recent Labs  Lab 09/11/17 0244 09/11/17 0726 09/11/17 1248  TROPONINI 0.03* <0.03 <0.03    Recent Labs  Lab 09/10/17 2320  TROPIPOC 0.00     BNPNo results for input(s): BNP, PROBNP in the last 168 hours.   DDimer No results for input(s): DDIMER in the last 168 hours.   Radiology    Nm Myocar Multi W/spect W/wall Motion / Ef  Result Date: 09/12/2017  Diffuse nonspecific T wave abnormalities.  Defect 1: There is a large defect of severe severity present in the basal inferoseptal, basal inferior, basal inferolateral, mid inferior, mid inferolateral and apical inferior location.  Findings consistent with prior myocardial infarction. There are no significant ischemic territories.  This is a high risk study based on severely reduced LVEF and extent of myocardial scar.  Nuclear stress EF: 15%.     Cardiac Studies   Myoview 09/12/2017  Diffuse nonspecific T wave abnormalities.  Defect 1: There is a large defect of severe severity present in the basal  inferoseptal, basal inferior, basal inferolateral, mid inferior, mid inferolateral and apical inferior location.  Findings consistent with prior myocardial infarction. There are no significant ischemic territories.  This is a high risk study based on severely reduced LVEF and extent of myocardial scar.  Nuclear stress EF: 15%.  Patient Profile     67 y.o. male with PMH of CAD s/p CABG 2000 with subsequent PCI (last 2018), PAF on eliquis, s/p ICD placement, tobacco abuse, CVA, DM type II on insulin, COPD, and dementia who presented with chest pain. He is visiting [place] from [place]. Last visit with cardiologist was before moving from [place] to [place] in 1-2 years ago. Reportedly had LHC in 2018 for chest pain and received a stent to unknown artery at the time. He also reports low EF 20-30%.   Assessment & Plan    1. Chest pain  - chest pain similar to previous angina   - myoview 09/12/2017 showed large scar in the basal inferoseptal, basal inferior, basal inferolateral, inferior and inferolateral location, no ischemic. But EF went down from reported 20-30% down to 15%.   - given persistent chest pain similar to previous angina and decreased EF, planned for cath today.   - however patient just found out his brother died over the weekend and adamant to go back to [place] now, I advised him to stay at least after cath as leaving prematurely against medical advise place him at risk of cardiac event, however he understands and still wish to go. Last chest pain was last night, lasted 3 min.   2. CAD s/p CABG: last cath early 2018 with stent placement  3. Ischemic cardiomyopathy s/p ICD: on lasix 20mg  BID at home, held in the setting of possible AKI Cr 1.44  - would recommend leave with lasix 20mg  daily instead, may take additional 20mg  as needed for swelling.  4. Atrial fibrillation: last dose of eliquis 7/5  5. HLD: on statin  6. DM II: on insulin  7. COPD  For questions or updates,  please contact [facility] Please consult [email or web address] for contact info under Cardiology/STEMI.   Ramond Dial, PA  09/14/2017, 8:30 AM     The patient was seen, examined and discussed with Azalee Course, PA-C and I agree with the above.   The patient's brother died last night, the patient is very tearful and understandably would like to go home, he denies any chest pain since yesterday. He had  a negative stress test yesterday, a cath was planned because of persistent chest pain that he described as typical angina for him. His vitals are stable and he is on good medical regimen. He is good for discharge. We will arrange a follow up with our clinic however if he moves back to [place] he will have to find a cardiologist there.   Tobias AlexanderKatarina Sanay Belmar, MD 09/14/2017

## 2017-09-14 NOTE — Progress Notes (Signed)
Pt son stated that pts brother had died and that the pt needed to leave. That they had flight plans for 11:30 am. I spoke with son regarding what could happen if pt did not have catheterization and pt had heart blockages that needed to be fixed. He said they had no other choice. I explained that pt could also leave and have adverse event such as death and he said they had to leave anyway. I called Caryl AdaHoa Meng PA and Dr. Ella JubileeArrien to speak with pt and family. Emelda Brothershristy Marlaysia Lenig RN

## 2017-09-14 NOTE — Progress Notes (Signed)
ANTICOAGULATION CONSULT NOTE   Pharmacy Consult for Heparin Indication: chest pain/ACS  No Known Allergies  Patient Measurements: Height: 6' (182.9 cm) Weight: 138 lb 6.4 oz (62.8 kg) IBW/kg (Calculated) : 77.6 Heparin Dosing Weight: 64kg  Vital Signs: Temp: 98.1 F (36.7 C) (07/08 0445) Temp Source: Oral (07/08 0445) BP: 111/73 (07/08 0445) Pulse Rate: 89 (07/08 0445)  Labs: Recent Labs    09/11/17 1248  09/12/17 0240 09/12/17 1230 09/13/17 0614 09/14/17 0352  HGB  --    < > 11.6*  --  11.8* 12.0*  HCT  --   --  37.9*  --  38.5* 38.2*  PLT  --   --  180  --  200 209  APTT  --   --  46* 66* 72*  --   HEPARINUNFRC  --    < > 1.38* 1.03* 0.45 0.26*  CREATININE  --   --  1.23  --  1.06 0.98  TROPONINI <0.03  --   --   --   --   --    < > = values in this interval not displayed.    Estimated Creatinine Clearance: 64.1 mL/min (by C-G formula based on SCr of 0.98 mg/dL).   Assessment: Patient is 67 year old male who presented with left sided chest pain/SOB on 7/4. Patient on apixaban PTA. Pharmacy consulted to transition to heparin infusion. Last dose of apixaban 0834 on 7/5. CBC stable, troponin <0.03, no documented signs or symptoms of bleeding. Due to timing of recent apixaban dosing, will monitor aPTT and heparin level until correlated.   Heparin level = 0.26 this AM  Goal of Therapy:  Heparin level 0.3-0.7 units/ml Monitor platelets by anticoagulation protocol: Yes   Plan:  Increase heparin to 1100 units / hr Follow up after cath  Thank you Okey RegalLisa Sidney Kann, PharmD [phone] 09/14/2017     8:31 AM

## 2017-09-14 NOTE — Progress Notes (Signed)
Patient has decided to leave the hospital against medical advice, will give prescriptions for nitroglycerin.   Patient understands the consequences of leaving the hospital without completing cardiac work up, including MI and death.  All questions were addressed.

## 2017-09-14 NOTE — Discharge Summary (Addendum)
Physician Discharge Summary  Larry Leach NID:[id number] DOB: 01/10/1951 DOA: 09/10/2017  PCP: System, Pcp Not In  Admit date: 09/10/2017 Discharge date: 09/14/2017  Admitted From: Home  Disposition:  Home.  Recommendations for Outpatient Follow-up and new medication changes:  1. Follow up with Primary care as soon as possible. 2. Patient has left the hospital against medical advice due to a recent family loss.  3. Prescription for aspiring and nitroglycerin given.  4. Percocet as needed for pain (5 tablets)   Home Health: no   Equipment/Devices: no   Discharge Condition: stable CODE STATUS: full  Diet recommendation: heart healthy  Brief/Interim Summary: 67 year old male who presented with chest pain.  He does have the significant past medical history for history of CVA, coronary artery disease status post bypass grafting, type 2 diabetes mellitus and paroxysmal atrial fibrillation.  Patient currently visiting from out of state, he developed acute precordial chest pain while sitting, moderate in intensity, radiated to the left shoulder/neck, associated with dyspnea and mild nausea.  Initial physical examination blood pressure 143/82, heart rate 85, respiratory rate 20, temperature 97.7, oxygen saturation 100%.  Moist mucous membranes, breath sounds diminished bilaterally, no wheezing, rales or rhonchi, heart sounds S1-S2  present and rhythmic, no gallops, rubs or murmurs, abdomen soft nontender, lower extremity edema.  Sodium 144, potassium 4.0, chloride 1 5, bicarb 30, glucose 85, creatinine 1.28, BUN 12, troponin I less than 0.03.  Chest x-ray with no infiltrates, defibrillator in place.  EKG with a pacing, left bundle branch block.   Patient was admitted to the hospital with the working diagnosis of chest pain to rule out acute coronary syndrome.  1. Angina type precordial chest pain in the setting of ischemic cardiomyopathy/ chronic systolic heart failure sp CABG and AICD. Patient was  admitted to the step telemetry unit, had serial cardiac enzymes and ekg, with no signs of active ischemia.  Further work-up with nuclear stress test showed a large defect in the basal inferior septal, basal inferior, mid inferior and mid inferior lateral and inferior apical inferior location.  Things consistent with prior myocardial infarction.  No significant ischemic territories, but the study was reported as high risk due to severe reduction on LV ejection fraction and extent of myocardial scar, ejection fraction 15%.  Cardiac catheterization was scheduled.  Continue aspirin and atorvastatin.  2.  Chronic atrial fibrillation.  Patient was placed on a telemetry monitor, he remained paced rhythm.   3.  Prerenal acute kidney injury.  Kidney function improved with supportive medical therapy.  Discharge creatinine 0.98.  4.  Type 2 diabetes mellitus.  Patient was placed on insulin sliding scale for glucose coverage and monitoring.  Capillary glucose remained well controlled.  5.  COPD.  No signs of exacerbation, continue bronchodilator therapy.  Patient left AGAINST MEDICAL ADVICE, he will need close follow-up as an outpatient.  Discharge Diagnoses:  Principal Problem:   Chest pain Active Problems:   Coronary artery disease   Diabetes mellitus without complication (HCC)   History of completed stroke   Dementia   Mild renal insufficiency   COPD (chronic obstructive pulmonary disease) (HCC)   PAF (paroxysmal atrial fibrillation) (HCC)   Unstable angina (HCC)    Discharge Instructions   Allergies as of 09/14/2017   No Known Allergies     Medication List    STOP taking these medications   aspirin 325 MG tablet Replaced by:  aspirin 81 MG chewable tablet     TAKE these medications  aspirin 81 MG chewable tablet Chew 1 tablet (81 mg total) by mouth daily. Replaces:  aspirin 325 MG tablet   atorvastatin 40 MG tablet Commonly known as:  LIPITOR Take 40 mg by mouth daily.    ELIQUIS 5 MG Tabs tablet Generic drug:  apixaban Take 5 mg by mouth 2 (two) times daily.   Fluticasone-Salmeterol 250-50 MCG/DOSE Aepb Commonly known as:  ADVAIR Inhale 1 puff into the lungs 2 (two) times daily.   furosemide 20 MG tablet Commonly known as:  LASIX Take 20 mg by mouth 2 (two) times daily.   insulin NPH-regular Human (70-30) 100 UNIT/ML injection Commonly known as:  NOVOLIN 70/30 Inject 10 Units into the skin 2 (two) times daily with a meal.   nitroGLYCERIN 0.4 MG SL tablet Commonly known as:  NITROSTAT Place 1 tablet (0.4 mg total) under the tongue every 5 (five) minutes x 3 doses as needed for chest pain.   oxyCODONE-acetaminophen 5-325 MG tablet Commonly known as:  PERCOCET/ROXICET Take 1 tablet by mouth every 8 (eight) hours as needed for severe pain. What changed:    how much to take  when to take this   tiotropium 18 MCG inhalation capsule Commonly known as:  SPIRIVA Place 18 mcg into inhaler and inhale daily.       No Known Allergies  Consultations:  Cardiology    Procedures/Studies: Dg Chest 2 View  Result Date: 09/10/2017 CLINICAL DATA:  Acute chest pain and shortness of breath today. EXAM: CHEST - 2 VIEW COMPARISON:  None. FINDINGS: The cardiomediastinal silhouette is unremarkable. CABG changes, coronary stents and LEFT AICD/pacemaker noted. There is no evidence of focal airspace disease, pulmonary edema, suspicious pulmonary nodule/mass, pleural effusion, or pneumothorax. No acute bony abnormalities are identified. IMPRESSION: No evidence of acute cardiopulmonary disease. Electronically Signed   By: Harmon Pier M.D.   On: 09/10/2017 23:00   Nm Myocar Multi W/spect W/wall Motion / Ef  Result Date: 09/12/2017  Diffuse nonspecific T wave abnormalities.  Defect 1: There is a large defect of severe severity present in the basal inferoseptal, basal inferior, basal inferolateral, mid inferior, mid inferolateral and apical inferior location.   Findings consistent with prior myocardial infarction. There are no significant ischemic territories.  This is a high risk study based on severely reduced LVEF and extent of myocardial scar.  Nuclear stress EF: 15%.        Subjective: No chest pain or dyspnea, no nausea or vomiting.   Discharge Exam: Vitals:   09/13/17 2002 09/14/17 0445  BP: 123/78 111/73  Pulse: 91 89  Resp:    Temp: 98.6 F (37 C) 98.1 F (36.7 C)  SpO2: 98% 98%   Vitals:   09/13/17 0918 09/13/17 1139 09/13/17 2002 09/14/17 0445  BP:  133/72 123/78 111/73  Pulse:  91 91 89  Resp:  20    Temp:  98.4 F (36.9 C) 98.6 F (37 C) 98.1 F (36.7 C)  TempSrc:  Oral Oral Oral  SpO2: 94% 94% 98% 98%  Weight:    62.8 kg (138 lb 6.4 oz)  Height:        General: Not in pain or dyspnea, deconditioned  Neurology: Awake and alert, non focal  E ENT: no pallor, no icterus, oral mucosa moist Cardiovascular: No JVD. S1-S2 present, rhythmic, no gallops, rubs, or murmurs. No lower extremity edema. Pulmonary: vesicular breath sounds bilaterally, adequate air movement, no wheezing, rhonchi or rales. Gastrointestinal. Abdomen with, no organomegaly, non tender, no rebound or guarding  Skin. No rashes Musculoskeletal: no joint deformities   The results of significant diagnostics from this hospitalization (including imaging, microbiology, ancillary and laboratory) are listed below for reference.     Microbiology: Recent Results (from the past 240 hour(s))  MRSA PCR Screening     Status: None   Collection Time: 09/11/17  1:51 AM  Result Value Ref Range Status   MRSA by PCR NEGATIVE NEGATIVE Final    Comment:        The GeneXpert MRSA Assay (FDA approved for NASAL specimens only), is one component of a comprehensive MRSA colonization surveillance program. It is not intended to diagnose MRSA infection nor to guide or monitor treatment for MRSA infections. Performed at [facility], [address]  [place], [place] [address]      Labs: BNP (last 3 results) No results for input(s): BNP in the last 8760 hours. Basic Metabolic Panel: Recent Labs  Lab 09/10/17 2238 09/11/17 0726 09/12/17 0240 09/13/17 0614 09/14/17 0352  NA 136 144 141 143 140  K 4.3 4.0 4.2 3.9 3.9  CL 97* 105 106 110 107  CO2 28 30 29 28 26   GLUCOSE 260* 85 233* 100* 196*  BUN 14 12 11 12 10   CREATININE 1.44* 1.28* 1.23 1.06 0.98  CALCIUM 8.8* 8.8* 8.7* 8.6* 8.5*   Liver Function Tests: Recent Labs  Lab 09/12/17 0240  AST 16  ALT 14  ALKPHOS 95  BILITOT 0.5  PROT 5.6*  ALBUMIN 3.2*   No results for input(s): LIPASE, AMYLASE in the last 168 hours. No results for input(s): AMMONIA in the last 168 hours. CBC: Recent Labs  Lab 09/10/17 2238 09/12/17 0240 09/13/17 0614 09/14/17 0352  WBC 5.8 4.3 4.7 4.8  HGB 11.6* 11.6* 11.8* 12.0*  HCT 37.5* 37.9* 38.5* 38.2*  MCV 94.5 94.8 95.1 92.7  PLT 177 180 200 209   Cardiac Enzymes: Recent Labs  Lab 09/11/17 0244 09/11/17 0726 09/11/17 1248  TROPONINI 0.03* <0.03 <0.03   BNP: Invalid input(s): POCBNP CBG: Recent Labs  Lab 09/13/17 0745 09/13/17 1137 09/13/17 1620 09/13/17 2055 09/14/17 0750  GLUCAP 102* 150* 149* 177* 114*   D-Dimer No results for input(s): DDIMER in the last 72 hours. Hgb A1c Recent Labs    09/12/17 0240  HGBA1C 7.6*   Lipid Profile No results for input(s): CHOL, HDL, LDLCALC, TRIG, CHOLHDL, LDLDIRECT in the last 72 hours. Thyroid function studies No results for input(s): TSH, T4TOTAL, T3FREE, THYROIDAB in the last 72 hours.  Invalid input(s): FREET3 Anemia work up No results for input(s): VITAMINB12, FOLATE, FERRITIN, TIBC, IRON, RETICCTPCT in the last 72 hours. Urinalysis No results found for: COLORURINE, APPEARANCEUR, LABSPEC, PHURINE, GLUCOSEU, HGBUR, BILIRUBINUR, KETONESUR, PROTEINUR, UROBILINOGEN, NITRITE, LEUKOCYTESUR Sepsis Labs Invalid input(s): PROCALCITONIN,  WBC,  LACTICIDVEN Microbiology Recent  Results (from the past 240 hour(s))  MRSA PCR Screening     Status: None   Collection Time: 09/11/17  1:51 AM  Result Value Ref Range Status   MRSA by PCR NEGATIVE NEGATIVE Final    Comment:        The GeneXpert MRSA Assay (FDA approved for NASAL specimens only), is one component of a comprehensive MRSA colonization surveillance program. It is not intended to diagnose MRSA infection nor to guide or monitor treatment for MRSA infections. Performed at [facility], [address] [place], [place] [address]      Time coordinating discharge: 45 minutes  SIGNED:   Coralie KeensMauricio Daniel Arrien, MD  [facility]  09/14/2017, 8:32 AM Pager [phone]  If 7PM-7AM, please contact night-coverage [email or web address] Password [email or web address]

## 2018-02-23 LAB — OTHER LAB ORDER: Hemoglobin A1C: 8.6 % — ABNORMAL HIGH (ref 4.8–5.6)

## 2018-03-02 ENCOUNTER — Other Ambulatory Visit: Payer: Self-pay | Admitting: Emergency Medicine

## 2018-03-02 ENCOUNTER — Inpatient Hospital Stay
Admission: EM | Admit: 2018-03-02 | Discharge: 2018-03-08 | DRG: 057 | Disposition: A | Discharge status: Home / Self Care | Payer: Medicare Other | Attending: Vascular Neurology | Admitting: Vascular Neurology

## 2018-03-02 ENCOUNTER — Inpatient Hospital Stay (HOSPITAL_COMMUNITY): Payer: Medicare Other | Admitting: Vascular Neurology

## 2018-03-02 ENCOUNTER — Encounter (HOSPITAL_BASED_OUTPATIENT_CLINIC_OR_DEPARTMENT_OTHER): Payer: Self-pay | Admitting: Neurology

## 2018-03-02 ENCOUNTER — Other Ambulatory Visit: Payer: Self-pay

## 2018-03-02 DIAGNOSIS — I6389 Other cerebral infarction: Secondary | ICD-10-CM

## 2018-03-02 DIAGNOSIS — Z951 Presence of aortocoronary bypass graft: Secondary | ICD-10-CM

## 2018-03-02 DIAGNOSIS — I69320 Aphasia following cerebral infarction: Secondary | ICD-10-CM

## 2018-03-02 DIAGNOSIS — Z79899 Other long term (current) drug therapy: Secondary | ICD-10-CM

## 2018-03-02 DIAGNOSIS — F329 Major depressive disorder, single episode, unspecified: Secondary | ICD-10-CM | POA: Diagnosis present

## 2018-03-02 DIAGNOSIS — Z794 Long term (current) use of insulin: Secondary | ICD-10-CM

## 2018-03-02 DIAGNOSIS — Z23 Encounter for immunization: Secondary | ICD-10-CM

## 2018-03-02 DIAGNOSIS — R778 Other specified abnormalities of plasma proteins: Secondary | ICD-10-CM | POA: Diagnosis present

## 2018-03-02 DIAGNOSIS — R339 Retention of urine, unspecified: Secondary | ICD-10-CM | POA: Diagnosis present

## 2018-03-02 DIAGNOSIS — F17210 Nicotine dependence, cigarettes, uncomplicated: Secondary | ICD-10-CM | POA: Diagnosis present

## 2018-03-02 DIAGNOSIS — Z9049 Acquired absence of other specified parts of digestive tract: Secondary | ICD-10-CM

## 2018-03-02 DIAGNOSIS — J449 Chronic obstructive pulmonary disease, unspecified: Secondary | ICD-10-CM | POA: Diagnosis present

## 2018-03-02 DIAGNOSIS — I4891 Unspecified atrial fibrillation: Secondary | ICD-10-CM | POA: Diagnosis present

## 2018-03-02 DIAGNOSIS — I110 Hypertensive heart disease with heart failure: Secondary | ICD-10-CM | POA: Diagnosis present

## 2018-03-02 DIAGNOSIS — G8194 Hemiplegia, unspecified affecting left nondominant side: Secondary | ICD-10-CM

## 2018-03-02 DIAGNOSIS — Z7982 Long term (current) use of aspirin: Secondary | ICD-10-CM

## 2018-03-02 DIAGNOSIS — I2510 Atherosclerotic heart disease of native coronary artery without angina pectoris: Secondary | ICD-10-CM | POA: Diagnosis present

## 2018-03-02 DIAGNOSIS — I6521 Occlusion and stenosis of right carotid artery: Secondary | ICD-10-CM | POA: Diagnosis present

## 2018-03-02 DIAGNOSIS — I69354 Hemiplegia and hemiparesis following cerebral infarction affecting left non-dominant side: Principal | ICD-10-CM

## 2018-03-02 DIAGNOSIS — R079 Chest pain, unspecified: Secondary | ICD-10-CM

## 2018-03-02 DIAGNOSIS — R0602 Shortness of breath: Secondary | ICD-10-CM

## 2018-03-02 DIAGNOSIS — Z95810 Presence of automatic (implantable) cardiac defibrillator: Secondary | ICD-10-CM

## 2018-03-02 DIAGNOSIS — E785 Hyperlipidemia, unspecified: Secondary | ICD-10-CM | POA: Diagnosis present

## 2018-03-02 DIAGNOSIS — R29706 NIHSS score 6: Secondary | ICD-10-CM | POA: Diagnosis present

## 2018-03-02 DIAGNOSIS — I5022 Chronic systolic (congestive) heart failure: Secondary | ICD-10-CM | POA: Diagnosis present

## 2018-03-02 DIAGNOSIS — Z955 Presence of coronary angioplasty implant and graft: Secondary | ICD-10-CM

## 2018-03-02 DIAGNOSIS — Z7901 Long term (current) use of anticoagulants: Secondary | ICD-10-CM

## 2018-03-02 DIAGNOSIS — I248 Other forms of acute ischemic heart disease: Secondary | ICD-10-CM | POA: Diagnosis present

## 2018-03-02 DIAGNOSIS — I4581 Long QT syndrome: Secondary | ICD-10-CM

## 2018-03-02 DIAGNOSIS — E1165 Type 2 diabetes mellitus with hyperglycemia: Secondary | ICD-10-CM

## 2018-03-02 DIAGNOSIS — K219 Gastro-esophageal reflux disease without esophagitis: Secondary | ICD-10-CM | POA: Diagnosis present

## 2018-03-02 DIAGNOSIS — G8929 Other chronic pain: Secondary | ICD-10-CM | POA: Diagnosis present

## 2018-03-02 LAB — EMERGENCY STROKE PANEL
Fibrinogen: 426 mg/dL (ref 150–450)
Hematocrit: 44 % (ref 38–50)
Hemoglobin: 14.3 g/dL (ref 13.0–18.0)
MCH: 31.4 pg (ref 27.3–33.6)
MCHC: 32.9 g/dL (ref 32.2–36.5)
MCV: 96 fL (ref 81–98)
Partial Thromboplastin Time: 29 s (ref 22–35)
Platelet Count: 200 10*3/uL (ref 150–400)
Prothrombin INR: 1.1 (ref 0.8–1.3)
Prothrombin Time Patient: 13.8 s (ref 10.7–15.6)
RBC: 4.55 10*6/uL (ref 4.40–5.60)
RDW-CV: 15.3 % — ABNORMAL HIGH (ref 11.6–14.4)
Thrombin Time: 15 s — ABNORMAL LOW (ref 16–25)
WBC: 10.25 10*3/uL — ABNORMAL HIGH (ref 4.3–10.0)

## 2018-03-02 LAB — URINALYSIS WITH REFLEX CULTURE
Bacteria, URN: NONE SEEN
Bilirubin (Qual), URN: NEGATIVE
Epith Cells_Renal/Trans,URN: NEGATIVE /HPF
Epith Cells_Squamous, URN: NEGATIVE /LPF
Ketones, URN: NEGATIVE mg/dL
Leukocyte Esterase, URN: POSITIVE — AB
Nitrite, URN: NEGATIVE
Protein (Alb Semiquant), URN: NEGATIVE mg/dL
Specific Gravity, URN: 1.015 g/mL (ref 1.006–1.027)
pH, URN: 6 (ref 5.0–8.0)

## 2018-03-02 LAB — COMPREHENSIVE METABOLIC PANEL
ALT (GPT): 20 U/L (ref 10–48)
AST (GOT): 17 U/L (ref 9–38)
Albumin: 3.9 g/dL (ref 3.5–5.2)
Alkaline Phosphatase (Total): 99 U/L (ref 36–161)
Anion Gap: 10 (ref 4–12)
Bilirubin (Total): 0.6 mg/dL (ref 0.2–1.3)
Calcium: 8.7 mg/dL — ABNORMAL LOW (ref 8.9–10.2)
Carbon Dioxide, Total: 29 meq/L (ref 22–32)
Chloride: 95 meq/L — ABNORMAL LOW (ref 98–108)
Creatinine: 1.15 mg/dL (ref 0.51–1.18)
GFR, Calc, African American: >60 mL/min/{1.73_m2} (ref >59)
GFR, Calc, European American: >60 mL/min/{1.73_m2} (ref >59)
Glucose: 314 mg/dL — ABNORMAL HIGH (ref 62–125)
Potassium: 4 meq/L (ref 3.6–5.2)
Protein (Total): 6.5 g/dL (ref 6.0–8.2)
Sodium: 134 meq/L — ABNORMAL LOW (ref 135–145)
Urea Nitrogen: 22 mg/dL — ABNORMAL HIGH (ref 8–21)

## 2018-03-02 LAB — LAB ADD ON ORDER

## 2018-03-02 LAB — CREATININE BY I_STAT (POC), ~~LOC~~: Creatinine (POC): 1.1 mg/dL (ref 0.51–1.18)

## 2018-03-02 LAB — TROPONIN_I
Troponin_I Interpretation: ELEVATED
Troponin_I: 0.04 ng/mL — ABNORMAL HIGH (ref <0.04)

## 2018-03-02 LAB — GLUCOSE POC, ~~LOC~~: Glucose (POC): 368 mg/dL — ABNORMAL HIGH (ref 62–125)

## 2018-03-03 DIAGNOSIS — I7091 Generalized atherosclerosis: Secondary | ICD-10-CM

## 2018-03-03 DIAGNOSIS — F17209 Nicotine dependence, unspecified, with unspecified nicotine-induced disorders: Secondary | ICD-10-CM

## 2018-03-03 DIAGNOSIS — Z8679 Personal history of other diseases of the circulatory system: Secondary | ICD-10-CM

## 2018-03-03 DIAGNOSIS — E785 Hyperlipidemia, unspecified: Secondary | ICD-10-CM

## 2018-03-03 DIAGNOSIS — I639 Cerebral infarction, unspecified: Secondary | ICD-10-CM

## 2018-03-03 DIAGNOSIS — I5020 Unspecified systolic (congestive) heart failure: Secondary | ICD-10-CM

## 2018-03-03 LAB — INFLUENZA A, B AND RSV, RAPID PCR
Rapid Influenza A PCR Result: NEGATIVE
Rapid Influenza B PCR Result: NEGATIVE
Rapid RSV PCR Result: NEGATIVE

## 2018-03-03 LAB — TROPONIN_I
Troponin_I Interpretation: ELEVATED
Troponin_I Interpretation: ELEVATED
Troponin_I: 0.05 ng/mL — ABNORMAL HIGH (ref <0.04)
Troponin_I: 0.04 ng/mL — ABNORMAL HIGH (ref <0.04)

## 2018-03-03 LAB — CBC (HEMOGRAM)
Hematocrit: 40 % (ref 38–50)
Hemoglobin: 13.2 g/dL (ref 13.0–18.0)
MCH: 31.4 pg (ref 27.3–33.6)
MCHC: 33.4 g/dL (ref 32.2–36.5)
MCV: 94 fL (ref 81–98)
Platelet Count: 183 10*3/uL (ref 150–400)
RBC: 4.2 10*6/uL — ABNORMAL LOW (ref 4.40–5.60)
RDW-CV: 15.2 % — ABNORMAL HIGH (ref 11.6–14.4)
WBC: 10.64 10*3/uL — ABNORMAL HIGH (ref 4.3–10.0)

## 2018-03-03 LAB — BASIC METABOLIC PANEL
Anion Gap: 8 (ref 4–12)
Calcium: 8.3 mg/dL — ABNORMAL LOW (ref 8.9–10.2)
Carbon Dioxide, Total: 28 meq/L (ref 22–32)
Chloride: 103 meq/L (ref 98–108)
Creatinine: 0.96 mg/dL (ref 0.51–1.18)
GFR, Calc, African American: >60 mL/min/{1.73_m2} (ref >59)
GFR, Calc, European American: >60 mL/min/{1.73_m2} (ref >59)
Glucose: 126 mg/dL — ABNORMAL HIGH (ref 62–125)
Potassium: 3.7 meq/L (ref 3.6–5.2)
Sodium: 139 meq/L (ref 135–145)
Urea Nitrogen: 20 mg/dL (ref 8–21)

## 2018-03-03 LAB — LIPID PANEL
Cholesterol (LDL): 91 mg/dL (ref <130)
Cholesterol/HDL Ratio: 3.3
HDL Cholesterol: 55 mg/dL (ref >39)
Non-HDL Cholesterol: 127 mg/dL (ref 0–159)
Total Cholesterol: 182 mg/dL (ref <200)
Triglyceride: 178 mg/dL — ABNORMAL HIGH (ref <150)

## 2018-03-03 LAB — REFLEX CULTURE FOR UA

## 2018-03-03 LAB — GLUCOSE POC, ~~LOC~~
Glucose (POC): 105 mg/dL (ref 62–125)
Glucose (POC): 152 mg/dL — ABNORMAL HIGH (ref 62–125)
Glucose (POC): 181 mg/dL — ABNORMAL HIGH (ref 62–125)
Glucose (POC): 260 mg/dL — ABNORMAL HIGH (ref 62–125)
Glucose (POC): 312 mg/dL — ABNORMAL HIGH (ref 62–125)

## 2018-03-03 LAB — B_TYPE NATRIURETIC PEPTIDE: B_Type Natriuretic Peptide: 259 pg/mL — ABNORMAL HIGH (ref <101)

## 2018-03-04 ENCOUNTER — Other Ambulatory Visit (HOSPITAL_COMMUNITY): Payer: Self-pay

## 2018-03-04 ENCOUNTER — Other Ambulatory Visit: Payer: Self-pay

## 2018-03-04 ENCOUNTER — Ambulatory Visit (HOSPITAL_COMMUNITY): Payer: Medicare Other

## 2018-03-04 DIAGNOSIS — I2510 Atherosclerotic heart disease of native coronary artery without angina pectoris: Secondary | ICD-10-CM

## 2018-03-04 DIAGNOSIS — Z95810 Presence of automatic (implantable) cardiac defibrillator: Secondary | ICD-10-CM

## 2018-03-04 DIAGNOSIS — I501 Left ventricular failure: Secondary | ICD-10-CM

## 2018-03-04 DIAGNOSIS — Z951 Presence of aortocoronary bypass graft: Secondary | ICD-10-CM

## 2018-03-04 DIAGNOSIS — I4891 Unspecified atrial fibrillation: Secondary | ICD-10-CM

## 2018-03-04 DIAGNOSIS — I6789 Other cerebrovascular disease: Secondary | ICD-10-CM

## 2018-03-04 DIAGNOSIS — I509 Heart failure, unspecified: Secondary | ICD-10-CM

## 2018-03-04 DIAGNOSIS — I4581 Long QT syndrome: Secondary | ICD-10-CM

## 2018-03-04 LAB — BASIC METABOLIC PANEL
Anion Gap: 9 (ref 4–12)
Calcium: 7.9 mg/dL — ABNORMAL LOW (ref 8.9–10.2)
Carbon Dioxide, Total: 23 meq/L (ref 22–32)
Chloride: 99 meq/L (ref 98–108)
Creatinine: 1.07 mg/dL (ref 0.51–1.18)
GFR, Calc, African American: >60 mL/min/{1.73_m2} (ref >59)
GFR, Calc, European American: >60 mL/min/{1.73_m2} (ref >59)
Glucose: 203 mg/dL — ABNORMAL HIGH (ref 62–125)
Potassium: 4.2 meq/L (ref 3.6–5.2)
Sodium: 131 meq/L — ABNORMAL LOW (ref 135–145)
Urea Nitrogen: 21 mg/dL (ref 8–21)

## 2018-03-04 LAB — EKG 12 LEAD
Atrial Rate: 90 {beats}/min
Diagnosis: NORMAL
P-R Interval: 130 ms
Q-T Interval: 410 ms
QRS Duration: 120 ms
QTC Calculation: 501 ms
R Axis: 92 degrees
T Axis: 52 degrees
Ventricular Rate: 90 {beats}/min

## 2018-03-04 LAB — TROPONIN_I
Troponin_I Interpretation: ELEVATED
Troponin_I: 0.04 ng/mL — ABNORMAL HIGH (ref <0.04)

## 2018-03-04 LAB — B_TYPE NATRIURETIC PEPTIDE: B_Type Natriuretic Peptide: 162 pg/mL — ABNORMAL HIGH (ref <101)

## 2018-03-04 LAB — URINE C/S: Culture: 11000

## 2018-03-04 LAB — GLUCOSE POC, ~~LOC~~
Glucose (POC): 200 mg/dL — ABNORMAL HIGH (ref 62–125)
Glucose (POC): 321 mg/dL — ABNORMAL HIGH (ref 62–125)
Glucose (POC): 195 mg/dL — ABNORMAL HIGH (ref 62–125)

## 2018-03-04 LAB — CBC (HEMOGRAM)
Hematocrit: 37 % — ABNORMAL LOW (ref 38–50)
Hemoglobin: 12.4 g/dL — ABNORMAL LOW (ref 13.0–18.0)
MCH: 32.5 pg (ref 27.3–33.6)
MCHC: 33.2 g/dL (ref 32.2–36.5)
MCV: 98 fL (ref 81–98)
Platelet Count: 80 10*3/uL — ABNORMAL LOW (ref 150–400)
RBC: 3.82 10*6/uL — ABNORMAL LOW (ref 4.40–5.60)
RDW-CV: 14.9 % — ABNORMAL HIGH (ref 11.6–14.4)
WBC: 10.73 10*3/uL — ABNORMAL HIGH (ref 4.3–10.0)

## 2018-03-04 LAB — PLATELET COUNT, CITRATED: Platelet Count, Citrate: 156 10*3/uL (ref 150–400)

## 2018-03-04 LAB — HEMOGLOBIN A1C, HPLC: Hemoglobin A1C: 8.3 % — ABNORMAL HIGH (ref 4.0–6.0)

## 2018-03-05 ENCOUNTER — Encounter (HOSPITAL_BASED_OUTPATIENT_CLINIC_OR_DEPARTMENT_OTHER): Payer: Self-pay | Admitting: Neurology

## 2018-03-05 ENCOUNTER — Ambulatory Visit (HOSPITAL_BASED_OUTPATIENT_CLINIC_OR_DEPARTMENT_OTHER): Payer: Medicare Other

## 2018-03-05 ENCOUNTER — Other Ambulatory Visit: Payer: Self-pay | Admitting: Neurology

## 2018-03-05 ENCOUNTER — Other Ambulatory Visit: Payer: Self-pay

## 2018-03-05 ENCOUNTER — Other Ambulatory Visit (HOSPITAL_BASED_OUTPATIENT_CLINIC_OR_DEPARTMENT_OTHER): Payer: Self-pay | Admitting: Neurology

## 2018-03-05 DIAGNOSIS — G459 Transient cerebral ischemic attack, unspecified: Secondary | ICD-10-CM

## 2018-03-05 DIAGNOSIS — R4701 Aphasia: Secondary | ICD-10-CM

## 2018-03-05 DIAGNOSIS — G8192 Hemiplegia, unspecified affecting left dominant side: Secondary | ICD-10-CM

## 2018-03-05 DIAGNOSIS — R079 Chest pain, unspecified: Secondary | ICD-10-CM

## 2018-03-05 DIAGNOSIS — I69354 Hemiplegia and hemiparesis following cerebral infarction affecting left non-dominant side: Secondary | ICD-10-CM

## 2018-03-05 DIAGNOSIS — E119 Type 2 diabetes mellitus without complications: Secondary | ICD-10-CM

## 2018-03-05 DIAGNOSIS — R9082 White matter disease, unspecified: Secondary | ICD-10-CM

## 2018-03-05 DIAGNOSIS — I69328 Other speech and language deficits following cerebral infarction: Secondary | ICD-10-CM

## 2018-03-05 DIAGNOSIS — I69318 Other symptoms and signs involving cognitive functions following cerebral infarction: Secondary | ICD-10-CM

## 2018-03-05 DIAGNOSIS — I6930 Unspecified sequelae of cerebral infarction: Secondary | ICD-10-CM

## 2018-03-05 DIAGNOSIS — D696 Thrombocytopenia, unspecified: Secondary | ICD-10-CM

## 2018-03-05 DIAGNOSIS — R449 Unspecified symptoms and signs involving general sensations and perceptions: Secondary | ICD-10-CM

## 2018-03-05 DIAGNOSIS — I69398 Other sequelae of cerebral infarction: Secondary | ICD-10-CM

## 2018-03-05 DIAGNOSIS — I69322 Dysarthria following cerebral infarction: Secondary | ICD-10-CM

## 2018-03-05 LAB — BASIC METABOLIC PANEL
Anion Gap: 8 (ref 4–12)
Calcium: 8.1 mg/dL — ABNORMAL LOW (ref 8.9–10.2)
Carbon Dioxide, Total: 24 meq/L (ref 22–32)
Chloride: 101 meq/L (ref 98–108)
Creatinine: 1.09 mg/dL (ref 0.51–1.18)
GFR, Calc, African American: >60 mL/min/{1.73_m2} (ref >59)
GFR, Calc, European American: >60 mL/min/{1.73_m2} (ref >59)
Glucose: 195 mg/dL — ABNORMAL HIGH (ref 62–125)
Potassium: 3.8 meq/L (ref 3.6–5.2)
Sodium: 133 meq/L — ABNORMAL LOW (ref 135–145)
Urea Nitrogen: 17 mg/dL (ref 8–21)

## 2018-03-05 LAB — EKG 12 LEAD
Atrial Rate: 60 {beats}/min
P Axis: -52 degrees
P-R Interval: 188 ms
Q-T Interval: 438 ms
QRS Duration: 124 ms
QTC Calculation: 521 ms
R Axis: 89 degrees
T Axis: 79 degrees
Ventricular Rate: 85 {beats}/min

## 2018-03-05 LAB — CBC (HEMOGRAM)
Hematocrit: 36 % — ABNORMAL LOW (ref 38–50)
Hematocrit: 36 % — ABNORMAL LOW (ref 38–50)
Hemoglobin: 12.3 g/dL — ABNORMAL LOW (ref 13.0–18.0)
Hemoglobin: 12.1 g/dL — ABNORMAL LOW (ref 13.0–18.0)
MCH: 32 pg (ref 27.3–33.6)
MCH: 31.8 pg (ref 27.3–33.6)
MCHC: 34.1 g/dL (ref 32.2–36.5)
MCHC: 33.8 g/dL (ref 32.2–36.5)
MCV: 94 fL (ref 81–98)
MCV: 94 fL (ref 81–98)
Platelet Count: 140 10*3/uL — ABNORMAL LOW (ref 150–400)
Platelet Count: 143 10*3/uL — ABNORMAL LOW (ref 150–400)
RBC: 3.84 10*6/uL — ABNORMAL LOW (ref 4.40–5.60)
RBC: 3.8 10*6/uL — ABNORMAL LOW (ref 4.40–5.60)
RDW-CV: 14.9 % — ABNORMAL HIGH (ref 11.6–14.4)
RDW-CV: 14.9 % — ABNORMAL HIGH (ref 11.6–14.4)
WBC: 11.75 10*3/uL — ABNORMAL HIGH (ref 4.3–10.0)
WBC: 10.69 10*3/uL — ABNORMAL HIGH (ref 4.3–10.0)

## 2018-03-05 LAB — LAB ADD ON ORDER

## 2018-03-05 LAB — GLUCOSE POC, ~~LOC~~
Glucose (POC): 208 mg/dL — ABNORMAL HIGH (ref 62–125)
Glucose (POC): 298 mg/dL — ABNORMAL HIGH (ref 62–125)
Glucose (POC): 75 mg/dL (ref 62–125)

## 2018-03-05 LAB — LIPID PANEL
Cholesterol (LDL): 68 mg/dL (ref <130)
Cholesterol/HDL Ratio: 2.9
HDL Cholesterol: 51 mg/dL (ref >39)
Non-HDL Cholesterol: 96 mg/dL (ref 0–159)
Total Cholesterol: 147 mg/dL (ref <200)
Triglyceride: 139 mg/dL (ref <150)

## 2018-03-05 LAB — TROPONIN_I
Troponin_I Interpretation: NORMAL
Troponin_I: 0.03 ng/mL (ref <0.04)

## 2018-03-05 LAB — TSH WITH REFLEXIVE FREE T4: TSH with Reflexive Free T4: 4.352 u[IU]/mL (ref 0.400–5.000)

## 2018-03-05 LAB — MAGNESIUM: Magnesium: 1.6 mg/dL — ABNORMAL LOW (ref 1.8–2.4)

## 2018-03-05 LAB — B_TYPE NATRIURETIC PEPTIDE: B_Type Natriuretic Peptide: 145 pg/mL — ABNORMAL HIGH (ref <101)

## 2018-03-05 LAB — VITAMIN B12 (COBALAMIN): Vitamin B12 (Cobalamin): 272 pg/mL (ref 180–914)

## 2018-03-05 NOTE — Progress Notes (Signed)
Patient was given instructions and hooked up to a 30-day Event monitor. Patient was sent home with monitor box and return labels.

## 2018-03-06 DIAGNOSIS — R29810 Facial weakness: Secondary | ICD-10-CM

## 2018-03-06 DIAGNOSIS — N399 Disorder of urinary system, unspecified: Secondary | ICD-10-CM

## 2018-03-06 DIAGNOSIS — I69920 Aphasia following unspecified cerebrovascular disease: Secondary | ICD-10-CM

## 2018-03-06 DIAGNOSIS — I69922 Dysarthria following unspecified cerebrovascular disease: Secondary | ICD-10-CM

## 2018-03-06 LAB — BASIC METABOLIC PANEL
Anion Gap: 8 (ref 4–12)
Calcium: 7.7 mg/dL — ABNORMAL LOW (ref 8.9–10.2)
Carbon Dioxide, Total: 24 meq/L (ref 22–32)
Chloride: 102 meq/L (ref 98–108)
Creatinine: 1.01 mg/dL (ref 0.51–1.18)
GFR, Calc, African American: >60 mL/min/{1.73_m2} (ref >59)
GFR, Calc, European American: >60 mL/min/{1.73_m2} (ref >59)
Glucose: 236 mg/dL — ABNORMAL HIGH (ref 62–125)
Potassium: 4.1 meq/L (ref 3.6–5.2)
Sodium: 134 meq/L — ABNORMAL LOW (ref 135–145)
Urea Nitrogen: 15 mg/dL (ref 8–21)

## 2018-03-06 LAB — EKG 12 LEAD
Atrial Rate: 89 {beats}/min
P-R Interval: 166 ms
Q-T Interval: 394 ms
QRS Duration: 118 ms
QTC Calculation: 479 ms
R Axis: 92 degrees
T Axis: 86 degrees
Ventricular Rate: 89 {beats}/min

## 2018-03-06 LAB — GLUCOSE POC, ~~LOC~~
Glucose (POC): 97 mg/dL (ref 62–125)
Glucose (POC): 156 mg/dL — ABNORMAL HIGH (ref 62–125)

## 2018-03-06 LAB — MAGNESIUM: Magnesium: 1.9 mg/dL (ref 1.8–2.4)

## 2018-03-07 DIAGNOSIS — I6920 Unspecified sequelae of other nontraumatic intracranial hemorrhage: Secondary | ICD-10-CM

## 2018-03-07 LAB — GLUCOSE POC, ~~LOC~~
Glucose (POC): 177 mg/dL — ABNORMAL HIGH (ref 62–125)
Glucose (POC): 159 mg/dL — ABNORMAL HIGH (ref 62–125)
Glucose (POC): 144 mg/dL — ABNORMAL HIGH (ref 62–125)

## 2018-03-08 LAB — GLUCOSE POC, ~~LOC~~: Glucose (POC): 213 mg/dL — ABNORMAL HIGH (ref 62–125)

## 2018-03-11 ENCOUNTER — Telehealth (HOSPITAL_BASED_OUTPATIENT_CLINIC_OR_DEPARTMENT_OTHER): Payer: Self-pay

## 2018-03-15 ENCOUNTER — Other Ambulatory Visit: Payer: Self-pay | Admitting: Unknown Physician Specialty

## 2018-03-15 ENCOUNTER — Other Ambulatory Visit: Payer: Self-pay

## 2018-03-15 ENCOUNTER — Other Ambulatory Visit: Payer: Self-pay | Admitting: Student in an Organized Health Care Education/Training Program

## 2018-03-15 ENCOUNTER — Inpatient Hospital Stay
Admission: EM | Admit: 2018-03-15 | Discharge: 2018-04-08 | DRG: 280 | Disposition: A | Discharge status: Home Health | Payer: Medicare Other | Attending: Cardiology | Admitting: Cardiology

## 2018-03-15 ENCOUNTER — Inpatient Hospital Stay (HOSPITAL_COMMUNITY): Payer: Medicare Other | Admitting: Cardiovascular Disease

## 2018-03-15 DIAGNOSIS — Z981 Arthrodesis status: Secondary | ICD-10-CM

## 2018-03-15 DIAGNOSIS — R0902 Hypoxemia: Secondary | ICD-10-CM | POA: Diagnosis not present

## 2018-03-15 DIAGNOSIS — I4581 Long QT syndrome: Secondary | ICD-10-CM

## 2018-03-15 DIAGNOSIS — E1165 Type 2 diabetes mellitus with hyperglycemia: Secondary | ICD-10-CM | POA: Diagnosis present

## 2018-03-15 DIAGNOSIS — I5022 Chronic systolic (congestive) heart failure: Secondary | ICD-10-CM | POA: Diagnosis present

## 2018-03-15 DIAGNOSIS — I951 Orthostatic hypotension: Secondary | ICD-10-CM | POA: Diagnosis not present

## 2018-03-15 DIAGNOSIS — T827XXA Infection and inflammatory reaction due to other cardiac and vascular devices, implants and grafts, initial encounter: Principal | ICD-10-CM | POA: Diagnosis present

## 2018-03-15 DIAGNOSIS — J449 Chronic obstructive pulmonary disease, unspecified: Secondary | ICD-10-CM

## 2018-03-15 DIAGNOSIS — G8929 Other chronic pain: Secondary | ICD-10-CM | POA: Diagnosis present

## 2018-03-15 DIAGNOSIS — R079 Chest pain, unspecified: Secondary | ICD-10-CM

## 2018-03-15 DIAGNOSIS — I5020 Unspecified systolic (congestive) heart failure: Secondary | ICD-10-CM

## 2018-03-15 DIAGNOSIS — R6520 Severe sepsis without septic shock: Secondary | ICD-10-CM | POA: Diagnosis present

## 2018-03-15 DIAGNOSIS — I2699 Other pulmonary embolism without acute cor pulmonale: Secondary | ICD-10-CM | POA: Diagnosis not present

## 2018-03-15 DIAGNOSIS — I25119 Atherosclerotic heart disease of native coronary artery with unspecified angina pectoris: Secondary | ICD-10-CM

## 2018-03-15 DIAGNOSIS — I69320 Aphasia following cerebral infarction: Secondary | ICD-10-CM

## 2018-03-15 DIAGNOSIS — W1830XA Fall on same level, unspecified, initial encounter: Secondary | ICD-10-CM | POA: Diagnosis present

## 2018-03-15 DIAGNOSIS — Z951 Presence of aortocoronary bypass graft: Secondary | ICD-10-CM

## 2018-03-15 DIAGNOSIS — K529 Noninfective gastroenteritis and colitis, unspecified: Secondary | ICD-10-CM | POA: Diagnosis present

## 2018-03-15 DIAGNOSIS — I214 Non-ST elevation (NSTEMI) myocardial infarction: Secondary | ICD-10-CM

## 2018-03-15 DIAGNOSIS — I69354 Hemiplegia and hemiparesis following cerebral infarction affecting left non-dominant side: Secondary | ICD-10-CM

## 2018-03-15 DIAGNOSIS — I4891 Unspecified atrial fibrillation: Secondary | ICD-10-CM

## 2018-03-15 DIAGNOSIS — A4181 Sepsis due to Enterococcus: Secondary | ICD-10-CM | POA: Diagnosis present

## 2018-03-15 DIAGNOSIS — I255 Ischemic cardiomyopathy: Secondary | ICD-10-CM | POA: Diagnosis present

## 2018-03-15 DIAGNOSIS — I21A1 Myocardial infarction type 2: Secondary | ICD-10-CM | POA: Diagnosis present

## 2018-03-15 DIAGNOSIS — R0602 Shortness of breath: Secondary | ICD-10-CM

## 2018-03-15 DIAGNOSIS — F17210 Nicotine dependence, cigarettes, uncomplicated: Secondary | ICD-10-CM | POA: Diagnosis present

## 2018-03-15 DIAGNOSIS — K2270 Barrett's esophagus without dysplasia: Secondary | ICD-10-CM | POA: Diagnosis present

## 2018-03-15 DIAGNOSIS — I472 Ventricular tachycardia: Secondary | ICD-10-CM | POA: Diagnosis not present

## 2018-03-15 DIAGNOSIS — Z95810 Presence of automatic (implantable) cardiac defibrillator: Secondary | ICD-10-CM

## 2018-03-15 DIAGNOSIS — I480 Paroxysmal atrial fibrillation: Secondary | ICD-10-CM | POA: Diagnosis present

## 2018-03-15 DIAGNOSIS — Z955 Presence of coronary angioplasty implant and graft: Secondary | ICD-10-CM

## 2018-03-15 DIAGNOSIS — D649 Anemia, unspecified: Secondary | ICD-10-CM | POA: Diagnosis present

## 2018-03-15 DIAGNOSIS — R0600 Dyspnea, unspecified: Secondary | ICD-10-CM

## 2018-03-15 DIAGNOSIS — Z794 Long term (current) use of insulin: Secondary | ICD-10-CM

## 2018-03-15 DIAGNOSIS — I2510 Atherosclerotic heart disease of native coronary artery without angina pectoris: Secondary | ICD-10-CM | POA: Diagnosis present

## 2018-03-15 DIAGNOSIS — Z7982 Long term (current) use of aspirin: Secondary | ICD-10-CM

## 2018-03-15 LAB — L LACTATE, VENOUS WB (TO U/H LAB WITHIN 30 MIN)
L Lactate (Direct), Venous Whole Blood: 3.7 mmol/L — ABNORMAL HIGH (ref 0.6–1.9)
L Lactate (Direct), Venous Whole Blood: 1.8 mmol/L (ref 0.6–1.9)

## 2018-03-15 LAB — URINALYSIS WITH REFLEX CULTURE
Bacteria, URN: NONE SEEN
Bilirubin (Qual), URN: NEGATIVE
Epith Cells_Renal/Trans,URN: NEGATIVE /HPF
Epith Cells_Squamous, URN: NEGATIVE /LPF
Ketones, URN: NEGATIVE mg/dL
Leukocyte Esterase, URN: NEGATIVE
Nitrite, URN: NEGATIVE
Protein (Alb Semiquant), URN: NEGATIVE mg/dL
Specific Gravity, URN: 1.02 g/mL (ref 1.006–1.027)
WBC, URN: NEGATIVE /HPF
pH, URN: 6 (ref 5.0–8.0)

## 2018-03-15 LAB — INFLUENZA A, B AND RSV, RAPID PCR
Rapid Influenza A PCR Result: NEGATIVE
Rapid Influenza B PCR Result: NEGATIVE
Rapid RSV PCR Result: NEGATIVE

## 2018-03-15 LAB — COMPREHENSIVE METABOLIC PANEL
ALT (GPT): 14 U/L (ref 10–48)
AST (GOT): 14 U/L (ref 9–38)
Albumin: 3.4 g/dL — ABNORMAL LOW (ref 3.5–5.2)
Alkaline Phosphatase (Total): 101 U/L (ref 36–161)
Anion Gap: 9 (ref 4–12)
Bilirubin (Total): 0.8 mg/dL (ref 0.2–1.3)
Calcium: 8.2 mg/dL — ABNORMAL LOW (ref 8.9–10.2)
Carbon Dioxide, Total: 26 meq/L (ref 22–32)
Chloride: 100 meq/L (ref 98–108)
Creatinine: 0.87 mg/dL (ref 0.51–1.18)
GFR, Calc, African American: >60 mL/min/{1.73_m2} (ref >59)
GFR, Calc, European American: >60 mL/min/{1.73_m2} (ref >59)
Glucose: 295 mg/dL — ABNORMAL HIGH (ref 62–125)
Potassium: 3.6 meq/L (ref 3.6–5.2)
Protein (Total): 6 g/dL (ref 6.0–8.2)
Sodium: 135 meq/L (ref 135–145)
Urea Nitrogen: 10 mg/dL (ref 8–21)

## 2018-03-15 LAB — TROPONIN_I: Troponin_I: 0.64 ng/mL (ref <0.04)

## 2018-03-15 LAB — PROTHROMBIN & PTT
Partial Thromboplastin Time: 29 s (ref 22–35)
Prothrombin INR: 1.1 (ref 0.8–1.3)
Prothrombin Time Patient: 14.1 s (ref 10.7–15.6)

## 2018-03-15 LAB — LAB ADD ON ORDER

## 2018-03-15 LAB — B_TYPE NATRIURETIC PEPTIDE: B_Type Natriuretic Peptide: 376 pg/mL — ABNORMAL HIGH (ref <101)

## 2018-03-15 LAB — GLUCOSE POC, ~~LOC~~: Glucose (POC): 305 mg/dL — ABNORMAL HIGH (ref 62–125)

## 2018-03-15 LAB — CBC (HEMOGRAM)
Hematocrit: 33 % — ABNORMAL LOW (ref 38–50)
Hemoglobin: 11.1 g/dL — ABNORMAL LOW (ref 13.0–18.0)
MCH: 31.5 pg (ref 27.3–33.6)
MCHC: 33.7 g/dL (ref 32.2–36.5)
MCV: 94 fL (ref 81–98)
Platelet Count: 160 10*3/uL (ref 150–400)
RBC: 3.52 10*6/uL — ABNORMAL LOW (ref 4.40–5.60)
RDW-CV: 14.6 % — ABNORMAL HIGH (ref 11.6–14.4)
WBC: 10.27 10*3/uL — ABNORMAL HIGH (ref 4.3–10.0)

## 2018-03-15 LAB — REFLEX CULTURE FOR UA

## 2018-03-16 ENCOUNTER — Ambulatory Visit (HOSPITAL_BASED_OUTPATIENT_CLINIC_OR_DEPARTMENT_OTHER): Payer: Medicare Other

## 2018-03-16 ENCOUNTER — Ambulatory Visit (HOSPITAL_COMMUNITY): Payer: Medicare Other

## 2018-03-16 ENCOUNTER — Other Ambulatory Visit (HOSPITAL_COMMUNITY): Payer: Self-pay

## 2018-03-16 ENCOUNTER — Other Ambulatory Visit: Payer: Self-pay

## 2018-03-16 ENCOUNTER — Other Ambulatory Visit: Payer: Self-pay | Admitting: Acute Care

## 2018-03-16 DIAGNOSIS — I4581 Long QT syndrome: Secondary | ICD-10-CM

## 2018-03-16 DIAGNOSIS — Z951 Presence of aortocoronary bypass graft: Secondary | ICD-10-CM

## 2018-03-16 DIAGNOSIS — I2510 Atherosclerotic heart disease of native coronary artery without angina pectoris: Secondary | ICD-10-CM

## 2018-03-16 DIAGNOSIS — T82855A Stenosis of coronary artery stent, initial encounter: Secondary | ICD-10-CM

## 2018-03-16 DIAGNOSIS — R2231 Localized swelling, mass and lump, right upper limb: Secondary | ICD-10-CM

## 2018-03-16 DIAGNOSIS — I959 Hypotension, unspecified: Secondary | ICD-10-CM

## 2018-03-16 DIAGNOSIS — I255 Ischemic cardiomyopathy: Secondary | ICD-10-CM

## 2018-03-16 DIAGNOSIS — R931 Abnormal findings on diagnostic imaging of heart and coronary circulation: Secondary | ICD-10-CM

## 2018-03-16 DIAGNOSIS — R69 Illness, unspecified: Secondary | ICD-10-CM

## 2018-03-16 LAB — EKG 12 LEAD
Atrial Rate: 112 {beats}/min
Atrial Rate: 93 {beats}/min
P Axis: 73 degrees
P-R Interval: 122 ms
P-R Interval: 178 ms
Q-T Interval: 370 ms
Q-T Interval: 434 ms
QRS Duration: 118 ms
QRS Duration: 114 ms
QTC Calculation: 505 ms
QTC Calculation: 539 ms
R Axis: 83 degrees
R Axis: 95 degrees
T Axis: -34 degrees
T Axis: 104 degrees
Ventricular Rate: 112 {beats}/min
Ventricular Rate: 93 {beats}/min

## 2018-03-16 LAB — CBC, DIFF
% Basophils: 0 %
% Eosinophils: 1 %
% Immature Granulocytes: 0 %
% Lymphocytes: 12 %
% Monocytes: 4 %
% Neutrophils: 83 %
% Nucleated RBC: 0 %
Absolute Eosinophil Count: 0.1 10*3/uL (ref 0.00–0.50)
Absolute Lymphocyte Count: 1.23 10*3/uL (ref 1.00–4.80)
Basophils: 0 10*3/uL (ref 0.00–0.20)
Hematocrit: 30 % — ABNORMAL LOW (ref 38–50)
Hemoglobin: 9.8 g/dL — ABNORMAL LOW (ref 13.0–18.0)
Immature Granulocytes: 0 10*3/uL (ref 0.00–0.05)
MCH: 31 pg (ref 27.3–33.6)
MCHC: 32.9 g/dL (ref 32.2–36.5)
MCV: 94 fL (ref 81–98)
Monocytes: 0.41 10*3/uL (ref 0.00–0.80)
Neutrophils: 8.51 10*3/uL — ABNORMAL HIGH (ref 1.80–7.00)
Nucleated RBC: 0 10*3/uL
Platelet Count: 151 10*3/uL (ref 150–400)
RBC: 3.16 10*6/uL — ABNORMAL LOW (ref 4.40–5.60)
RDW-CV: 14.5 % — ABNORMAL HIGH (ref 11.6–14.4)
WBC: 10.25 10*3/uL — ABNORMAL HIGH (ref 4.3–10.0)

## 2018-03-16 LAB — RESPIRATORY VIRUS PANEL, SEMI-QUANT PCR
Adenovirus PCR Qual Result: NOT DETECTED
Bocavirus PCR Qual Result: NOT DETECTED
Coronavirus PCR Qual Result: NOT DETECTED
Influenza A PCR Qual Result: NOT DETECTED
Influenza B PCR Qual Result: NOT DETECTED
Metapneumovirus PCR Qual Rslt: NOT DETECTED
Parainfluenza 1 PCR Qual Rslt: NOT DETECTED
Parainfluenza 2 PCR Qual Rslt: NOT DETECTED
Parainfluenza 3 PCR Qual Rslt: NOT DETECTED
Parainfluenza 4 PCR Qual Rslt: NOT DETECTED
Rhinovirus PCR Qual Result: NOT DETECTED
Rsv PCR Qual Result: NOT DETECTED

## 2018-03-16 LAB — PTT ANTI XA PANEL
Anti-Xa for Unfractionated Heparin: <0.1 [IU]/mL
Anti-Xa for Unfractionated Heparin: 0.12 [IU]/mL
Partial Thromboplastin Time: 56 s — ABNORMAL HIGH (ref 22–35)
Partial Thromboplastin Time: 71 s — ABNORMAL HIGH (ref 22–35)

## 2018-03-16 LAB — ACT LOW RANGE (POC), ~~LOC~~: ACT Low Range (POC): 263 s

## 2018-03-16 LAB — GLUCOSE POC, ~~LOC~~
Glucose (POC): 131 mg/dL — ABNORMAL HIGH (ref 62–125)
Glucose (POC): 191 mg/dL — ABNORMAL HIGH (ref 62–125)
Glucose (POC): 104 mg/dL (ref 62–125)
Glucose (POC): 96 mg/dL (ref 62–125)
Glucose (POC): 219 mg/dL — ABNORMAL HIGH (ref 62–125)

## 2018-03-16 LAB — URINE C/S: Culture: 11000

## 2018-03-16 LAB — TROPONIN_I: Troponin_I: 0.68 ng/mL (ref <0.04)

## 2018-03-17 DIAGNOSIS — R7881 Bacteremia: Secondary | ICD-10-CM

## 2018-03-17 DIAGNOSIS — B952 Enterococcus as the cause of diseases classified elsewhere: Secondary | ICD-10-CM

## 2018-03-17 LAB — PHOSPHATE: Phosphate: 2.4 mg/dL — ABNORMAL LOW (ref 2.5–4.5)

## 2018-03-17 LAB — CBC, DIFF
% Basophils: 1 %
% Eosinophils: 4 %
% Immature Granulocytes: 0 %
% Lymphocytes: 16 %
% Monocytes: 11 %
% Neutrophils: 68 %
% Nucleated RBC: 0 %
Absolute Eosinophil Count: 0.18 10*3/uL (ref 0.00–0.50)
Absolute Lymphocyte Count: 0.76 10*3/uL — ABNORMAL LOW (ref 1.00–4.80)
Basophils: 0.04 10*3/uL (ref 0.00–0.20)
Hematocrit: 36 % — ABNORMAL LOW (ref 38–50)
Hemoglobin: 11.7 g/dL — ABNORMAL LOW (ref 13.0–18.0)
Immature Granulocytes: 0.02 10*3/uL (ref 0.00–0.05)
MCH: 31.3 pg (ref 27.3–33.6)
MCHC: 32.3 g/dL (ref 32.2–36.5)
MCV: 97 fL (ref 81–98)
Monocytes: 0.51 10*3/uL (ref 0.00–0.80)
Neutrophils: 3.37 10*3/uL (ref 1.80–7.00)
Nucleated RBC: 0 10*3/uL
Platelet Count: 203 10*3/uL (ref 150–400)
RBC: 3.74 10*6/uL — ABNORMAL LOW (ref 4.40–5.60)
RDW-CV: 14.5 % — ABNORMAL HIGH (ref 11.6–14.4)
WBC: 4.88 10*3/uL (ref 4.3–10.0)

## 2018-03-17 LAB — HEPATIC FUNCTION PANEL
ALT (GPT): 15 U/L (ref 10–48)
AST (GOT): 14 U/L (ref 9–38)
Albumin: 3.4 g/dL — ABNORMAL LOW (ref 3.5–5.2)
Alkaline Phosphatase (Total): 113 U/L (ref 36–161)
Bilirubin (Direct): 0.1 mg/dL (ref 0.0–0.3)
Bilirubin (Total): 0.4 mg/dL (ref 0.2–1.3)
Protein (Total): 6.2 g/dL (ref 6.0–8.2)

## 2018-03-17 LAB — ACT LOW RANGE (POC), ~~LOC~~
ACT Low Range (POC): 202 s
ACT Low Range (POC): 171 s
ACT Low Range (POC): 157 s

## 2018-03-17 LAB — BASIC METABOLIC PANEL
Anion Gap: 6 (ref 4–12)
Calcium: 8.2 mg/dL — ABNORMAL LOW (ref 8.9–10.2)
Carbon Dioxide, Total: 27 meq/L (ref 22–32)
Chloride: 104 meq/L (ref 98–108)
Creatinine: 1.01 mg/dL (ref 0.51–1.18)
GFR, Calc, African American: >60 mL/min/{1.73_m2} (ref >59)
GFR, Calc, European American: >60 mL/min/{1.73_m2} (ref >59)
Glucose: 300 mg/dL — ABNORMAL HIGH (ref 62–125)
Potassium: 3.3 meq/L — ABNORMAL LOW (ref 3.6–5.2)
Sodium: 137 meq/L (ref 135–145)
Urea Nitrogen: 10 mg/dL (ref 8–21)

## 2018-03-17 LAB — R/O MRSA

## 2018-03-17 LAB — GLUCOSE POC, ~~LOC~~
Glucose (POC): 303 mg/dL — ABNORMAL HIGH (ref 62–125)
Glucose (POC): 118 mg/dL (ref 62–125)
Glucose (POC): 335 mg/dL — ABNORMAL HIGH (ref 62–125)

## 2018-03-17 LAB — MAGNESIUM: Magnesium: 1.8 mg/dL (ref 1.8–2.4)

## 2018-03-17 LAB — PROTHROMBIN TIME
Prothrombin INR: 1 (ref 0.8–1.3)
Prothrombin Time Patient: 13.3 s (ref 10.7–15.6)

## 2018-03-18 DIAGNOSIS — D649 Anemia, unspecified: Secondary | ICD-10-CM

## 2018-03-18 LAB — BASIC METABOLIC PANEL
Anion Gap: 9 (ref 4–12)
Calcium: 7.8 mg/dL — ABNORMAL LOW (ref 8.9–10.2)
Carbon Dioxide, Total: 23 meq/L (ref 22–32)
Chloride: 105 meq/L (ref 98–108)
Creatinine: 0.8 mg/dL (ref 0.51–1.18)
GFR, Calc, African American: >60 mL/min/{1.73_m2} (ref >59)
GFR, Calc, European American: >60 mL/min/{1.73_m2} (ref >59)
Glucose: 280 mg/dL — ABNORMAL HIGH (ref 62–125)
Potassium: 4.1 meq/L (ref 3.6–5.2)
Sodium: 137 meq/L (ref 135–145)
Urea Nitrogen: 8 mg/dL (ref 8–21)

## 2018-03-18 LAB — CBC (HEMOGRAM)
Hematocrit: 33 % — ABNORMAL LOW (ref 38–50)
Hemoglobin: 10.6 g/dL — ABNORMAL LOW (ref 13.0–18.0)
MCH: 31.2 pg (ref 27.3–33.6)
MCHC: 32.5 g/dL (ref 32.2–36.5)
MCV: 96 fL (ref 81–98)
Platelet Count: 202 10*3/uL (ref 150–400)
RBC: 3.4 10*6/uL — ABNORMAL LOW (ref 4.40–5.60)
RDW-CV: 14.4 % (ref 11.6–14.4)
WBC: 4.69 10*3/uL (ref 4.3–10.0)

## 2018-03-18 LAB — HEPATIC FUNCTION PANEL
ALT (GPT): 11 U/L (ref 10–48)
AST (GOT): 11 U/L (ref 9–38)
Albumin: 2.9 g/dL — ABNORMAL LOW (ref 3.5–5.2)
Alkaline Phosphatase (Total): 85 U/L (ref 36–161)
Bilirubin (Direct): 0.1 mg/dL (ref 0.0–0.3)
Bilirubin (Total): 0.3 mg/dL (ref 0.2–1.3)
Protein (Total): 5.5 g/dL — ABNORMAL LOW (ref 6.0–8.2)

## 2018-03-18 LAB — PROTHROMBIN TIME
Prothrombin INR: 1 (ref 0.8–1.3)
Prothrombin Time Patient: 13.7 s (ref 10.7–15.6)

## 2018-03-18 LAB — GLUCOSE POC, ~~LOC~~
Glucose (POC): 227 mg/dL — ABNORMAL HIGH (ref 62–125)
Glucose (POC): 340 mg/dL — ABNORMAL HIGH (ref 62–125)
Glucose (POC): 71 mg/dL (ref 62–125)
Glucose (POC): 266 mg/dL — ABNORMAL HIGH (ref 62–125)

## 2018-03-18 LAB — PHOSPHATE: Phosphate: 2 mg/dL — ABNORMAL LOW (ref 2.5–4.5)

## 2018-03-18 LAB — TROPONIN_I
Troponin_I Interpretation: ELEVATED
Troponin_I: 0.28 ng/mL — ABNORMAL HIGH (ref <0.04)

## 2018-03-18 LAB — MAGNESIUM: Magnesium: 1.8 mg/dL (ref 1.8–2.4)

## 2018-03-19 ENCOUNTER — Other Ambulatory Visit: Payer: Self-pay

## 2018-03-19 ENCOUNTER — Other Ambulatory Visit: Payer: Self-pay | Admitting: Unknown Physician Specialty

## 2018-03-19 DIAGNOSIS — R079 Chest pain, unspecified: Secondary | ICD-10-CM

## 2018-03-19 DIAGNOSIS — R634 Abnormal weight loss: Secondary | ICD-10-CM

## 2018-03-19 LAB — RETICULOCYTE COUNT
Absolute Reticulocyte Count: 67 10*9/L (ref 25–125)
RETIC HEMOGLOBIN EQUIVALENT: 33.4 pg (ref 28.0–38.0)
Reticulocyte Count, %: 2 % (ref 0.5–2.5)

## 2018-03-19 LAB — LAB ADD ON ORDER

## 2018-03-19 LAB — BASIC METABOLIC PANEL
Anion Gap: 7 (ref 4–12)
Calcium: 7.8 mg/dL — ABNORMAL LOW (ref 8.9–10.2)
Carbon Dioxide, Total: 26 meq/L (ref 22–32)
Chloride: 104 meq/L (ref 98–108)
Creatinine: 0.92 mg/dL (ref 0.51–1.18)
GFR, Calc, African American: >60 mL/min/{1.73_m2} (ref >59)
GFR, Calc, European American: >60 mL/min/{1.73_m2} (ref >59)
Glucose: 143 mg/dL — ABNORMAL HIGH (ref 62–125)
Potassium: 3.7 meq/L (ref 3.6–5.2)
Sodium: 137 meq/L (ref 135–145)
Urea Nitrogen: 10 mg/dL (ref 8–21)

## 2018-03-19 LAB — CBC (HEMOGRAM)
Hematocrit: 32 % — ABNORMAL LOW (ref 38–50)
Hemoglobin: 10.6 g/dL — ABNORMAL LOW (ref 13.0–18.0)
MCH: 31.4 pg (ref 27.3–33.6)
MCHC: 33 g/dL (ref 32.2–36.5)
MCV: 95 fL (ref 81–98)
Platelet Count: 219 10*3/uL (ref 150–400)
RBC: 3.38 10*6/uL — ABNORMAL LOW (ref 4.40–5.60)
RDW-CV: 14.5 % — ABNORMAL HIGH (ref 11.6–14.4)
WBC: 4.37 10*3/uL (ref 4.3–10.0)

## 2018-03-19 LAB — MAGNESIUM: Magnesium: 2 mg/dL (ref 1.8–2.4)

## 2018-03-19 LAB — GLUCOSE POC, ~~LOC~~
Glucose (POC): 186 mg/dL — ABNORMAL HIGH (ref 62–125)
Glucose (POC): 142 mg/dL — ABNORMAL HIGH (ref 62–125)
Glucose (POC): 158 mg/dL — ABNORMAL HIGH (ref 62–125)
Glucose (POC): 102 mg/dL (ref 62–125)
Glucose (POC): 71 mg/dL (ref 62–125)
Glucose (POC): 118 mg/dL (ref 62–125)
Glucose (POC): 280 mg/dL — ABNORMAL HIGH (ref 62–125)

## 2018-03-19 LAB — POTASSIUM, SERUM: Potassium: 4.4 meq/L (ref 3.6–5.2)

## 2018-03-19 LAB — IRON BINDING CAPACITY (W/IRON, TRANSFERRIN & TRANSF SAT)
Iron, SRM: 41 ug/dL (ref 31–171)
Total Iron Binding Capacity: 237 ug/dL — ABNORMAL LOW (ref 250–460)
Transferrin Saturation: 17 % (ref 15–50)
Transferrin: 169 mg/dL — ABNORMAL LOW (ref 180–329)

## 2018-03-19 LAB — PHOSPHATE: Phosphate: 2 mg/dL — ABNORMAL LOW (ref 2.5–4.5)

## 2018-03-19 LAB — FERRITIN: Ferritin: 195 ng/mL (ref 20–230)

## 2018-03-20 DIAGNOSIS — R079 Chest pain, unspecified: Secondary | ICD-10-CM

## 2018-03-20 DIAGNOSIS — G8929 Other chronic pain: Secondary | ICD-10-CM

## 2018-03-20 LAB — CBC (HEMOGRAM)
Hematocrit: 33 % — ABNORMAL LOW (ref 38–50)
Hemoglobin: 10.6 g/dL — ABNORMAL LOW (ref 13.0–18.0)
MCH: 31.3 pg (ref 27.3–33.6)
MCHC: 32.4 g/dL (ref 32.2–36.5)
MCV: 97 fL (ref 81–98)
Platelet Count: 256 10*3/uL (ref 150–400)
RBC: 3.39 10*6/uL — ABNORMAL LOW (ref 4.40–5.60)
RDW-CV: 14.7 % — ABNORMAL HIGH (ref 11.6–14.4)
WBC: 4.93 10*3/uL (ref 4.3–10.0)

## 2018-03-20 LAB — BASIC METABOLIC PANEL
Anion Gap: 6 (ref 4–12)
Calcium: 8.1 mg/dL — ABNORMAL LOW (ref 8.9–10.2)
Carbon Dioxide, Total: 26 meq/L (ref 22–32)
Chloride: 106 meq/L (ref 98–108)
Creatinine: 0.94 mg/dL (ref 0.51–1.18)
GFR, Calc, African American: >60 mL/min/{1.73_m2} (ref >59)
GFR, Calc, European American: >60 mL/min/{1.73_m2} (ref >59)
Glucose: 128 mg/dL — ABNORMAL HIGH (ref 62–125)
Potassium: 4 meq/L (ref 3.6–5.2)
Sodium: 138 meq/L (ref 135–145)
Urea Nitrogen: 9 mg/dL (ref 8–21)

## 2018-03-20 LAB — LAB ADD ON ORDER

## 2018-03-20 LAB — URINALYSIS WITH REFLEX CULTURE
Bacteria, URN: NONE SEEN
Bilirubin (Qual), URN: NEGATIVE
Epith Cells_Renal/Trans,URN: NEGATIVE /HPF
Epith Cells_Squamous, URN: NEGATIVE /LPF
Ketones, URN: NEGATIVE mg/dL
Leukocyte Esterase, URN: NEGATIVE
Nitrite, URN: NEGATIVE
Occult Blood, URN: NEGATIVE
Protein (Alb Semiquant), URN: NEGATIVE mg/dL
RBC, URN: NEGATIVE /HPF
Specific Gravity, URN: 1.019 g/mL (ref 1.006–1.027)
WBC, URN: NEGATIVE /HPF
pH, URN: 6 (ref 5.0–8.0)

## 2018-03-20 LAB — PHOSPHATE: Phosphate: 2.6 mg/dL (ref 2.5–4.5)

## 2018-03-20 LAB — MAGNESIUM: Magnesium: 1.8 mg/dL (ref 1.8–2.4)

## 2018-03-20 LAB — GLUCOSE POC, ~~LOC~~
Glucose (POC): 128 mg/dL — ABNORMAL HIGH (ref 62–125)
Glucose (POC): 130 mg/dL — ABNORMAL HIGH (ref 62–125)
Glucose (POC): 154 mg/dL — ABNORMAL HIGH (ref 62–125)
Glucose (POC): 149 mg/dL — ABNORMAL HIGH (ref 62–125)
Glucose (POC): 139 mg/dL — ABNORMAL HIGH (ref 62–125)

## 2018-03-20 LAB — BLOOD C/S: Culture: NO GROWTH

## 2018-03-21 DIAGNOSIS — I5022 Chronic systolic (congestive) heart failure: Secondary | ICD-10-CM

## 2018-03-21 DIAGNOSIS — I208 Other forms of angina pectoris: Secondary | ICD-10-CM

## 2018-03-21 LAB — EKG 12 LEAD
Atrial Rate: 87 {beats}/min
P Axis: 97 degrees
P-R Interval: 206 ms
Q-T Interval: 438 ms
QRS Duration: 116 ms
QTC Calculation: 527 ms
R Axis: -87 degrees
T Axis: 270 degrees
Ventricular Rate: 87 {beats}/min

## 2018-03-21 LAB — CBC (HEMOGRAM)
Hematocrit: 34 % — ABNORMAL LOW (ref 38–50)
Hemoglobin: 11.1 g/dL — ABNORMAL LOW (ref 13.0–18.0)
MCH: 31.1 pg (ref 27.3–33.6)
MCHC: 32.6 g/dL (ref 32.2–36.5)
MCV: 96 fL (ref 81–98)
Platelet Count: 284 10*3/uL (ref 150–400)
RBC: 3.57 10*6/uL — ABNORMAL LOW (ref 4.40–5.60)
RDW-CV: 15 % — ABNORMAL HIGH (ref 11.6–14.4)
WBC: 4.72 10*3/uL (ref 4.3–10.0)

## 2018-03-21 LAB — BASIC METABOLIC PANEL
Anion Gap: 9 (ref 4–12)
Calcium: 8.3 mg/dL — ABNORMAL LOW (ref 8.9–10.2)
Carbon Dioxide, Total: 24 meq/L (ref 22–32)
Chloride: 108 meq/L (ref 98–108)
Creatinine: 0.9 mg/dL (ref 0.51–1.18)
GFR, Calc, African American: >60 mL/min/{1.73_m2} (ref >59)
GFR, Calc, European American: >60 mL/min/{1.73_m2} (ref >59)
Glucose: 114 mg/dL (ref 62–125)
Potassium: 3.9 meq/L (ref 3.6–5.2)
Sodium: 141 meq/L (ref 135–145)
Urea Nitrogen: 9 mg/dL (ref 8–21)

## 2018-03-21 LAB — GLUCOSE POC, ~~LOC~~
Glucose (POC): 115 mg/dL (ref 62–125)
Glucose (POC): 134 mg/dL — ABNORMAL HIGH (ref 62–125)
Glucose (POC): 207 mg/dL — ABNORMAL HIGH (ref 62–125)
Glucose (POC): 128 mg/dL — ABNORMAL HIGH (ref 62–125)

## 2018-03-21 LAB — PHOSPHATE: Phosphate: 2.8 mg/dL (ref 2.5–4.5)

## 2018-03-21 LAB — MAGNESIUM: Magnesium: 1.8 mg/dL (ref 1.8–2.4)

## 2018-03-22 ENCOUNTER — Other Ambulatory Visit (HOSPITAL_BASED_OUTPATIENT_CLINIC_OR_DEPARTMENT_OTHER): Payer: Medicare Other

## 2018-03-22 ENCOUNTER — Other Ambulatory Visit: Payer: Self-pay | Admitting: Unknown Physician Specialty

## 2018-03-22 DIAGNOSIS — A419 Sepsis, unspecified organism: Secondary | ICD-10-CM

## 2018-03-22 DIAGNOSIS — E119 Type 2 diabetes mellitus without complications: Secondary | ICD-10-CM

## 2018-03-22 LAB — GLUCOSE POC, ~~LOC~~
Glucose (POC): 60 mg/dL — ABNORMAL LOW (ref 62–125)
Glucose (POC): 117 mg/dL (ref 62–125)
Glucose (POC): 87 mg/dL (ref 62–125)
Glucose (POC): 74 mg/dL (ref 62–125)
Glucose (POC): 73 mg/dL (ref 62–125)
Glucose (POC): 203 mg/dL — ABNORMAL HIGH (ref 62–125)
Glucose (POC): 76 mg/dL (ref 62–125)

## 2018-03-22 LAB — CBC (HEMOGRAM)
Hematocrit: 32 % — ABNORMAL LOW (ref 38–50)
Hemoglobin: 10.3 g/dL — ABNORMAL LOW (ref 13.0–18.0)
MCH: 31.3 pg (ref 27.3–33.6)
MCHC: 32.6 g/dL (ref 32.2–36.5)
MCV: 96 fL (ref 81–98)
Platelet Count: 269 10*3/uL (ref 150–400)
RBC: 3.29 10*6/uL — ABNORMAL LOW (ref 4.40–5.60)
RDW-CV: 15 % — ABNORMAL HIGH (ref 11.6–14.4)
WBC: 4.45 10*3/uL (ref 4.3–10.0)

## 2018-03-22 LAB — BASIC METABOLIC PANEL
Anion Gap: 7 (ref 4–12)
Calcium: 7.8 mg/dL — ABNORMAL LOW (ref 8.9–10.2)
Carbon Dioxide, Total: 25 meq/L (ref 22–32)
Chloride: 108 meq/L (ref 98–108)
Creatinine: 0.98 mg/dL (ref 0.51–1.18)
GFR, Calc, African American: >60 mL/min/{1.73_m2} (ref >59)
GFR, Calc, European American: >60 mL/min/{1.73_m2} (ref >59)
Glucose: 96 mg/dL (ref 62–125)
Potassium: 3.5 meq/L — ABNORMAL LOW (ref 3.6–5.2)
Sodium: 140 meq/L (ref 135–145)
Urea Nitrogen: 8 mg/dL (ref 8–21)

## 2018-03-22 LAB — EKG 12 LEAD
Atrial Rate: 85 {beats}/min
P Axis: 0 degrees
P-R Interval: 200 ms
Q-T Interval: 444 ms
QRS Duration: 122 ms
QTC Calculation: 528 ms
R Axis: 87 degrees
T Axis: 63 degrees
Ventricular Rate: 85 {beats}/min

## 2018-03-22 LAB — PHOSPHATE: Phosphate: 3.1 mg/dL (ref 2.5–4.5)

## 2018-03-22 LAB — MAGNESIUM: Magnesium: 1.7 mg/dL — ABNORMAL LOW (ref 1.8–2.4)

## 2018-03-23 ENCOUNTER — Ambulatory Visit (HOSPITAL_COMMUNITY): Payer: Medicare Other

## 2018-03-23 ENCOUNTER — Other Ambulatory Visit (HOSPITAL_COMMUNITY): Payer: Self-pay

## 2018-03-23 DIAGNOSIS — R7881 Bacteremia: Secondary | ICD-10-CM

## 2018-03-23 DIAGNOSIS — Z95810 Presence of automatic (implantable) cardiac defibrillator: Secondary | ICD-10-CM

## 2018-03-23 LAB — GLUCOSE POC, ~~LOC~~
Glucose (POC): 276 mg/dL — ABNORMAL HIGH (ref 62–125)
Glucose (POC): 33 mg/dL — CL (ref 62–125)
Glucose (POC): 35 mg/dL — CL (ref 62–125)
Glucose (POC): 37 mg/dL — CL (ref 62–125)
Glucose (POC): 38 mg/dL — CL (ref 62–125)
Glucose (POC): 120 mg/dL (ref 62–125)
Glucose (POC): 160 mg/dL — ABNORMAL HIGH (ref 62–125)
Glucose (POC): 251 mg/dL — ABNORMAL HIGH (ref 62–125)
Glucose (POC): 300 mg/dL — ABNORMAL HIGH (ref 62–125)
Glucose (POC): 331 mg/dL — ABNORMAL HIGH (ref 62–125)

## 2018-03-23 LAB — BLOOD C/S: Culture: NO GROWTH

## 2018-03-23 LAB — GLUCOSE SERUM, NONFASTING: Glucose: 35 mg/dL — CL (ref 62–125)

## 2018-03-24 ENCOUNTER — Other Ambulatory Visit: Payer: Self-pay | Admitting: Nurse Practitioner

## 2018-03-24 ENCOUNTER — Encounter (HOSPITAL_BASED_OUTPATIENT_CLINIC_OR_DEPARTMENT_OTHER): Payer: Self-pay | Admitting: Internal Medicine

## 2018-03-24 DIAGNOSIS — E8770 Fluid overload, unspecified: Secondary | ICD-10-CM

## 2018-03-24 DIAGNOSIS — R9431 Abnormal electrocardiogram [ECG] [EKG]: Secondary | ICD-10-CM

## 2018-03-24 DIAGNOSIS — Z4502 Encounter for adjustment and management of automatic implantable cardiac defibrillator: Secondary | ICD-10-CM

## 2018-03-24 LAB — BASIC METABOLIC PANEL
Anion Gap: 6 (ref 4–12)
Calcium: 8.1 mg/dL — ABNORMAL LOW (ref 8.9–10.2)
Carbon Dioxide, Total: 27 meq/L (ref 22–32)
Chloride: 107 meq/L (ref 98–108)
Creatinine: 1.14 mg/dL (ref 0.51–1.18)
GFR, Calc, African American: >60 mL/min/{1.73_m2} (ref >59)
GFR, Calc, European American: >60 mL/min/{1.73_m2} (ref >59)
Glucose: 118 mg/dL (ref 62–125)
Potassium: 4.3 meq/L (ref 3.6–5.2)
Sodium: 140 meq/L (ref 135–145)
Urea Nitrogen: 10 mg/dL (ref 8–21)

## 2018-03-24 LAB — GLUCOSE POC, ~~LOC~~
Glucose (POC): 113 mg/dL (ref 62–125)
Glucose (POC): 192 mg/dL — ABNORMAL HIGH (ref 62–125)
Glucose (POC): 193 mg/dL — ABNORMAL HIGH (ref 62–125)
Glucose (POC): 248 mg/dL — ABNORMAL HIGH (ref 62–125)
Glucose (POC): 265 mg/dL — ABNORMAL HIGH (ref 62–125)
Glucose (POC): 88 mg/dL (ref 62–125)
Glucose (POC): 97 mg/dL (ref 62–125)

## 2018-03-24 LAB — CBC (HEMOGRAM)
Hematocrit: 33 % — ABNORMAL LOW (ref 38–50)
Hemoglobin: 10.2 g/dL — ABNORMAL LOW (ref 13.0–18.0)
MCH: 31.2 pg (ref 27.3–33.6)
MCHC: 31.4 g/dL — ABNORMAL LOW (ref 32.2–36.5)
MCV: 99 fL — ABNORMAL HIGH (ref 81–98)
Platelet Count: 269 10*3/uL (ref 150–400)
RBC: 3.27 10*6/uL — ABNORMAL LOW (ref 4.40–5.60)
RDW-CV: 15.3 % — ABNORMAL HIGH (ref 11.6–14.4)
WBC: 7.06 10*3/uL (ref 4.3–10.0)

## 2018-03-24 LAB — MAGNESIUM: Magnesium: 1.6 mg/dL — ABNORMAL LOW (ref 1.8–2.4)

## 2018-03-24 LAB — PHOSPHATE: Phosphate: 3.1 mg/dL (ref 2.5–4.5)

## 2018-03-25 LAB — BASIC METABOLIC PANEL
Anion Gap: 7 (ref 4–12)
Anion Gap: 30 — ABNORMAL HIGH (ref 4–12)
Calcium: 8.1 mg/dL — ABNORMAL LOW (ref 8.9–10.2)
Calcium: <4 mg/dL — CL (ref 8.9–10.2)
Carbon Dioxide, Total: 26 meq/L (ref 22–32)
Carbon Dioxide, Total: 17 meq/L — ABNORMAL LOW (ref 22–32)
Chloride: 107 meq/L (ref 98–108)
Chloride: 121 meq/L — ABNORMAL HIGH (ref 98–108)
Creatinine: 1.13 mg/dL (ref 0.51–1.18)
Creatinine: 0.53 mg/dL (ref 0.51–1.18)
GFR, Calc, African American: >60 mL/min/{1.73_m2} (ref >59)
GFR, Calc, African American: >60 mL/min/{1.73_m2} (ref >59)
GFR, Calc, European American: >60 mL/min/{1.73_m2} (ref >59)
GFR, Calc, European American: >60 mL/min/{1.73_m2} (ref >59)
Glucose: 83 mg/dL (ref 62–125)
Glucose: 79 mg/dL (ref 62–125)
Potassium: 3.8 meq/L (ref 3.6–5.2)
Potassium: 2 meq/L — CL (ref 3.6–5.2)
Sodium: 140 meq/L (ref 135–145)
Sodium: 168 meq/L (ref 135–145)
Urea Nitrogen: 8 mg/dL (ref 8–21)
Urea Nitrogen: 4 mg/dL — ABNORMAL LOW (ref 8–21)

## 2018-03-25 LAB — BLOOD C/S
Culture: NO GROWTH
Culture: NO GROWTH

## 2018-03-25 LAB — CBC (HEMOGRAM)
Hematocrit: 36 % — ABNORMAL LOW (ref 38–50)
Hemoglobin: 11.4 g/dL — ABNORMAL LOW (ref 13.0–18.0)
MCH: 31.5 pg (ref 27.3–33.6)
MCHC: 32 g/dL — ABNORMAL LOW (ref 32.2–36.5)
MCV: 98 fL (ref 81–98)
Platelet Count: 305 10*3/uL (ref 150–400)
RBC: 3.62 10*6/uL — ABNORMAL LOW (ref 4.40–5.60)
RDW-CV: 15.4 % — ABNORMAL HIGH (ref 11.6–14.4)
WBC: 7.28 10*3/uL (ref 4.3–10.0)

## 2018-03-25 LAB — RENAL FUNCTION PANEL
Albumin: 3.2 g/dL — ABNORMAL LOW (ref 3.5–5.2)
Anion Gap: 9 (ref 4–12)
Calcium: 7.9 mg/dL — ABNORMAL LOW (ref 8.9–10.2)
Carbon Dioxide, Total: 29 meq/L (ref 22–32)
Chloride: 103 meq/L (ref 98–108)
Creatinine: 1.16 mg/dL (ref 0.51–1.18)
GFR, Calc, African American: >60 mL/min/{1.73_m2} (ref >59)
GFR, Calc, European American: >60 mL/min/{1.73_m2} (ref >59)
Glucose: 101 mg/dL (ref 62–125)
Phosphate: 3 mg/dL (ref 2.5–4.5)
Potassium: 3.2 meq/L — ABNORMAL LOW (ref 3.6–5.2)
Sodium: 141 meq/L (ref 135–145)
Urea Nitrogen: 8 mg/dL (ref 8–21)

## 2018-03-25 LAB — GLUCOSE POC, ~~LOC~~
Glucose (POC): 255 mg/dL — ABNORMAL HIGH (ref 62–125)
Glucose (POC): 113 mg/dL (ref 62–125)
Glucose (POC): 84 mg/dL (ref 62–125)
Glucose (POC): 114 mg/dL (ref 62–125)
Glucose (POC): 138 mg/dL — ABNORMAL HIGH (ref 62–125)
Glucose (POC): 182 mg/dL — ABNORMAL HIGH (ref 62–125)

## 2018-03-25 LAB — PHOSPHATE: Phosphate: 2.8 mg/dL (ref 2.5–4.5)

## 2018-03-25 LAB — MAGNESIUM
Magnesium: 2 mg/dL (ref 1.8–2.4)
Magnesium: 0.9 mg/dL — CL (ref 1.8–2.4)

## 2018-03-26 ENCOUNTER — Ambulatory Visit (HOSPITAL_COMMUNITY): Payer: Medicare Other

## 2018-03-26 ENCOUNTER — Other Ambulatory Visit: Payer: Self-pay | Admitting: Unknown Physician Specialty

## 2018-03-26 ENCOUNTER — Other Ambulatory Visit: Payer: Self-pay

## 2018-03-26 DIAGNOSIS — R7881 Bacteremia: Secondary | ICD-10-CM

## 2018-03-26 DIAGNOSIS — R918 Other nonspecific abnormal finding of lung field: Secondary | ICD-10-CM

## 2018-03-26 DIAGNOSIS — J439 Emphysema, unspecified: Secondary | ICD-10-CM

## 2018-03-26 DIAGNOSIS — I330 Acute and subacute infective endocarditis: Secondary | ICD-10-CM

## 2018-03-26 DIAGNOSIS — I209 Angina pectoris, unspecified: Secondary | ICD-10-CM

## 2018-03-26 DIAGNOSIS — R9431 Abnormal electrocardiogram [ECG] [EKG]: Secondary | ICD-10-CM

## 2018-03-26 DIAGNOSIS — R0902 Hypoxemia: Secondary | ICD-10-CM

## 2018-03-26 DIAGNOSIS — A419 Sepsis, unspecified organism: Secondary | ICD-10-CM

## 2018-03-26 DIAGNOSIS — Z951 Presence of aortocoronary bypass graft: Secondary | ICD-10-CM

## 2018-03-26 LAB — BASIC METABOLIC PANEL
Anion Gap: 8 (ref 4–12)
Calcium: 7.9 mg/dL — ABNORMAL LOW (ref 8.9–10.2)
Carbon Dioxide, Total: 26 meq/L (ref 22–32)
Chloride: 106 meq/L (ref 98–108)
Creatinine: 1.01 mg/dL (ref 0.51–1.18)
GFR, Calc, African American: >60 mL/min/{1.73_m2} (ref >59)
GFR, Calc, European American: >60 mL/min/{1.73_m2} (ref >59)
Glucose: 122 mg/dL (ref 62–125)
Potassium: 4 meq/L (ref 3.6–5.2)
Sodium: 140 meq/L (ref 135–145)
Urea Nitrogen: 8 mg/dL (ref 8–21)

## 2018-03-26 LAB — CBC (HEMOGRAM)
Hematocrit: 35 % — ABNORMAL LOW (ref 38–50)
Hemoglobin: 11.4 g/dL — ABNORMAL LOW (ref 13.0–18.0)
MCH: 31.9 pg (ref 27.3–33.6)
MCHC: 32.9 g/dL (ref 32.2–36.5)
MCV: 97 fL (ref 81–98)
Platelet Count: 271 10*3/uL (ref 150–400)
RBC: 3.57 10*6/uL — ABNORMAL LOW (ref 4.40–5.60)
RDW-CV: 15.6 % — ABNORMAL HIGH (ref 11.6–14.4)
WBC: 9.34 10*3/uL (ref 4.3–10.0)

## 2018-03-26 LAB — PHOSPHATE: Phosphate: 2.3 mg/dL — ABNORMAL LOW (ref 2.5–4.5)

## 2018-03-26 LAB — GLUCOSE POC, ~~LOC~~
Glucose (POC): 125 mg/dL (ref 62–125)
Glucose (POC): 110 mg/dL (ref 62–125)
Glucose (POC): 204 mg/dL — ABNORMAL HIGH (ref 62–125)
Glucose (POC): 180 mg/dL — ABNORMAL HIGH (ref 62–125)

## 2018-03-26 LAB — BLOOD C/S
Culture: NO GROWTH
Culture: NO GROWTH

## 2018-03-26 LAB — MAGNESIUM: Magnesium: 1.7 mg/dL — ABNORMAL LOW (ref 1.8–2.4)

## 2018-03-27 ENCOUNTER — Ambulatory Visit (HOSPITAL_COMMUNITY): Payer: Medicare Other

## 2018-03-27 ENCOUNTER — Other Ambulatory Visit: Payer: Self-pay | Admitting: Unknown Physician Specialty

## 2018-03-27 DIAGNOSIS — I2699 Other pulmonary embolism without acute cor pulmonale: Secondary | ICD-10-CM

## 2018-03-27 LAB — BASIC METABOLIC PANEL
Anion Gap: 6 (ref 4–12)
Calcium: 8.3 mg/dL — ABNORMAL LOW (ref 8.9–10.2)
Carbon Dioxide, Total: 29 meq/L (ref 22–32)
Chloride: 105 meq/L (ref 98–108)
Creatinine: 1.19 mg/dL — ABNORMAL HIGH (ref 0.51–1.18)
GFR, Calc, African American: >60 mL/min/{1.73_m2} (ref >59)
GFR, Calc, European American: >60 mL/min/{1.73_m2} (ref >59)
Glucose: 121 mg/dL (ref 62–125)
Potassium: 4.4 meq/L (ref 3.6–5.2)
Sodium: 140 meq/L (ref 135–145)
Urea Nitrogen: 10 mg/dL (ref 8–21)

## 2018-03-27 LAB — PHOSPHATE: Phosphate: 2.4 mg/dL — ABNORMAL LOW (ref 2.5–4.5)

## 2018-03-27 LAB — LAB ORDER, MICROBIOLOGY

## 2018-03-27 LAB — C. DIFFICILE TOXIN GENE BY PCR: C. difficile Toxin Gene by PCR: NEGATIVE

## 2018-03-27 LAB — CBC (HEMOGRAM)
Hematocrit: 37 % — ABNORMAL LOW (ref 38–50)
Hemoglobin: 11.7 g/dL — ABNORMAL LOW (ref 13.0–18.0)
MCH: 31.2 pg (ref 27.3–33.6)
MCHC: 31.4 g/dL — ABNORMAL LOW (ref 32.2–36.5)
MCV: 100 fL — ABNORMAL HIGH (ref 81–98)
Platelet Count: 255 10*3/uL (ref 150–400)
RBC: 3.75 10*6/uL — ABNORMAL LOW (ref 4.40–5.60)
RDW-CV: 15.7 % — ABNORMAL HIGH (ref 11.6–14.4)
WBC: 6.08 10*3/uL (ref 4.3–10.0)

## 2018-03-27 LAB — MAGNESIUM: Magnesium: 1.9 mg/dL (ref 1.8–2.4)

## 2018-03-27 LAB — GLUCOSE POC, ~~LOC~~
Glucose (POC): 165 mg/dL — ABNORMAL HIGH (ref 62–125)
Glucose (POC): 143 mg/dL — ABNORMAL HIGH (ref 62–125)
Glucose (POC): 188 mg/dL — ABNORMAL HIGH (ref 62–125)
Glucose (POC): 163 mg/dL — ABNORMAL HIGH (ref 62–125)

## 2018-03-28 DIAGNOSIS — R197 Diarrhea, unspecified: Secondary | ICD-10-CM

## 2018-03-28 LAB — CBC (HEMOGRAM)
Hematocrit: 33 % — ABNORMAL LOW (ref 38–50)
Hemoglobin: 10.6 g/dL — ABNORMAL LOW (ref 13.0–18.0)
MCH: 31.1 pg (ref 27.3–33.6)
MCHC: 32 g/dL — ABNORMAL LOW (ref 32.2–36.5)
MCV: 97 fL (ref 81–98)
Platelet Count: 257 10*3/uL (ref 150–400)
RBC: 3.41 10*6/uL — ABNORMAL LOW (ref 4.40–5.60)
RDW-CV: 15.4 % — ABNORMAL HIGH (ref 11.6–14.4)
WBC: 10.18 10*3/uL — ABNORMAL HIGH (ref 4.3–10.0)

## 2018-03-28 LAB — BASIC METABOLIC PANEL
Anion Gap: 5 (ref 4–12)
Calcium: 8 mg/dL — ABNORMAL LOW (ref 8.9–10.2)
Carbon Dioxide, Total: 29 meq/L (ref 22–32)
Chloride: 105 meq/L (ref 98–108)
Creatinine: 1.23 mg/dL — ABNORMAL HIGH (ref 0.51–1.18)
GFR, Calc, African American: >60 mL/min/{1.73_m2} (ref >59)
GFR, Calc, European American: 58 mL/min/{1.73_m2} — ABNORMAL LOW (ref >59)
Glucose: 138 mg/dL — ABNORMAL HIGH (ref 62–125)
Potassium: 3.8 meq/L (ref 3.6–5.2)
Sodium: 139 meq/L (ref 135–145)
Urea Nitrogen: 10 mg/dL (ref 8–21)

## 2018-03-28 LAB — GLUCOSE POC, ~~LOC~~
Glucose (POC): 125 mg/dL (ref 62–125)
Glucose (POC): 87 mg/dL (ref 62–125)
Glucose (POC): 115 mg/dL (ref 62–125)
Glucose (POC): 89 mg/dL (ref 62–125)
Glucose (POC): 96 mg/dL (ref 62–125)
Glucose (POC): 179 mg/dL — ABNORMAL HIGH (ref 62–125)

## 2018-03-28 LAB — PHOSPHATE: Phosphate: 2.6 mg/dL (ref 2.5–4.5)

## 2018-03-28 LAB — MAGNESIUM: Magnesium: 1.7 mg/dL — ABNORMAL LOW (ref 1.8–2.4)

## 2018-03-29 ENCOUNTER — Other Ambulatory Visit: Payer: Self-pay

## 2018-03-29 DIAGNOSIS — I4581 Long QT syndrome: Secondary | ICD-10-CM

## 2018-03-29 DIAGNOSIS — R079 Chest pain, unspecified: Secondary | ICD-10-CM

## 2018-03-29 DIAGNOSIS — R0600 Dyspnea, unspecified: Secondary | ICD-10-CM

## 2018-03-29 DIAGNOSIS — I2699 Other pulmonary embolism without acute cor pulmonale: Secondary | ICD-10-CM

## 2018-03-29 LAB — BLOOD C/S

## 2018-03-29 LAB — CBC (HEMOGRAM)
Hematocrit: 33 % — ABNORMAL LOW (ref 38–50)
Hemoglobin: 10.6 g/dL — ABNORMAL LOW (ref 13.0–18.0)
MCH: 31.5 pg (ref 27.3–33.6)
MCHC: 32.3 g/dL (ref 32.2–36.5)
MCV: 98 fL (ref 81–98)
Platelet Count: 234 10*3/uL (ref 150–400)
RBC: 3.36 10*6/uL — ABNORMAL LOW (ref 4.40–5.60)
RDW-CV: 15.4 % — ABNORMAL HIGH (ref 11.6–14.4)
WBC: 5.02 10*3/uL (ref 4.3–10.0)

## 2018-03-29 LAB — BASIC METABOLIC PANEL
Anion Gap: 6 (ref 4–12)
Calcium: 7.9 mg/dL — ABNORMAL LOW (ref 8.9–10.2)
Carbon Dioxide, Total: 27 meq/L (ref 22–32)
Chloride: 109 meq/L — ABNORMAL HIGH (ref 98–108)
Creatinine: 1.11 mg/dL (ref 0.51–1.18)
GFR, Calc, African American: >60 mL/min/{1.73_m2} (ref >59)
GFR, Calc, European American: >60 mL/min/{1.73_m2} (ref >59)
Glucose: 117 mg/dL (ref 62–125)
Potassium: 3.8 meq/L (ref 3.6–5.2)
Sodium: 142 meq/L (ref 135–145)
Urea Nitrogen: 8 mg/dL (ref 8–21)

## 2018-03-29 LAB — GLUCOSE POC, ~~LOC~~
Glucose (POC): 95 mg/dL (ref 62–125)
Glucose (POC): 143 mg/dL — ABNORMAL HIGH (ref 62–125)
Glucose (POC): 233 mg/dL — ABNORMAL HIGH (ref 62–125)

## 2018-03-29 LAB — POTASSIUM, SERUM
Potassium: 3.7 meq/L (ref 3.6–5.2)
Potassium: 3.6 meq/L (ref 3.6–5.2)
Potassium: 3.7 meq/L (ref 3.6–5.2)

## 2018-03-29 LAB — MAGNESIUM
Magnesium: 1.8 mg/dL (ref 1.8–2.4)
Magnesium: 2.2 mg/dL (ref 1.8–2.4)

## 2018-03-29 LAB — PHOSPHATE: Phosphate: 2.8 mg/dL (ref 2.5–4.5)

## 2018-03-30 ENCOUNTER — Other Ambulatory Visit: Payer: Self-pay

## 2018-03-30 DIAGNOSIS — R9431 Abnormal electrocardiogram [ECG] [EKG]: Secondary | ICD-10-CM

## 2018-03-30 DIAGNOSIS — R079 Chest pain, unspecified: Secondary | ICD-10-CM

## 2018-03-30 LAB — CBC (HEMOGRAM)
Hematocrit: 31 % — ABNORMAL LOW (ref 38–50)
Hemoglobin: 10.3 g/dL — ABNORMAL LOW (ref 13.0–18.0)
MCH: 32 pg (ref 27.3–33.6)
MCHC: 32.8 g/dL (ref 32.2–36.5)
MCV: 98 fL (ref 81–98)
Platelet Count: 218 10*3/uL (ref 150–400)
RBC: 3.22 10*6/uL — ABNORMAL LOW (ref 4.40–5.60)
RDW-CV: 15.1 % — ABNORMAL HIGH (ref 11.6–14.4)
WBC: 4.53 10*3/uL (ref 4.3–10.0)

## 2018-03-30 LAB — EKG 12 LEAD
Atrial Rate: 62 {beats}/min
Atrial Rate: 70 {beats}/min
Atrial Rate: 72 {beats}/min
Atrial Rate: 69 {beats}/min
P Axis: 69 degrees
P Axis: 65 degrees
P Axis: 62 degrees
P-R Interval: 182 ms
P-R Interval: 146 ms
P-R Interval: 146 ms
P-R Interval: 174 ms
Q-T Interval: 490 ms
Q-T Interval: 462 ms
Q-T Interval: 494 ms
Q-T Interval: 484 ms
QRS Duration: 120 ms
QRS Duration: 114 ms
QRS Duration: 118 ms
QRS Duration: 120 ms
QTC Calculation: 501 ms
QTC Calculation: 498 ms
QTC Calculation: 540 ms
QTC Calculation: 518 ms
R Axis: 90 degrees
R Axis: 85 degrees
R Axis: 73 degrees
R Axis: 74 degrees
T Axis: 83 degrees
T Axis: 86 degrees
T Axis: 161 degrees
T Axis: 103 degrees
Ventricular Rate: 63 {beats}/min
Ventricular Rate: 70 {beats}/min
Ventricular Rate: 72 {beats}/min
Ventricular Rate: 69 {beats}/min

## 2018-03-30 LAB — BASIC METABOLIC PANEL
Anion Gap: 8 (ref 4–12)
Calcium: 7.9 mg/dL — ABNORMAL LOW (ref 8.9–10.2)
Carbon Dioxide, Total: 25 meq/L (ref 22–32)
Chloride: 110 meq/L — ABNORMAL HIGH (ref 98–108)
Creatinine: 1.05 mg/dL (ref 0.51–1.18)
GFR, Calc, African American: >60 mL/min/{1.73_m2} (ref >59)
GFR, Calc, European American: >60 mL/min/{1.73_m2} (ref >59)
Glucose: 140 mg/dL — ABNORMAL HIGH (ref 62–125)
Potassium: 3.9 meq/L (ref 3.6–5.2)
Sodium: 143 meq/L (ref 135–145)
Urea Nitrogen: 9 mg/dL (ref 8–21)

## 2018-03-30 LAB — GLUCOSE POC, ~~LOC~~
Glucose (POC): 114 mg/dL (ref 62–125)
Glucose (POC): 147 mg/dL — ABNORMAL HIGH (ref 62–125)
Glucose (POC): 149 mg/dL — ABNORMAL HIGH (ref 62–125)
Glucose (POC): 117 mg/dL (ref 62–125)
Glucose (POC): 125 mg/dL (ref 62–125)

## 2018-03-30 LAB — MAGNESIUM: Magnesium: 1.9 mg/dL (ref 1.8–2.4)

## 2018-03-30 LAB — PHOSPHATE: Phosphate: 3.1 mg/dL (ref 2.5–4.5)

## 2018-03-31 LAB — EKG 12 LEAD
Atrial Rate: 70 {beats}/min
Atrial Rate: 64 {beats}/min
Atrial Rate: 68 {beats}/min
P Axis: 56 degrees
P Axis: 98 degrees
P-R Interval: 160 ms
P-R Interval: 168 ms
P-R Interval: 186 ms
Q-T Interval: 484 ms
Q-T Interval: 500 ms
Q-T Interval: 508 ms
QRS Duration: 124 ms
QRS Duration: 122 ms
QRS Duration: 124 ms
QTC Calculation: 522 ms
QTC Calculation: 515 ms
QTC Calculation: 524 ms
R Axis: 79 degrees
R Axis: 78 degrees
R Axis: 83 degrees
T Axis: 91 degrees
T Axis: 93 degrees
T Axis: 99 degrees
Ventricular Rate: 70 {beats}/min
Ventricular Rate: 64 {beats}/min
Ventricular Rate: 64 {beats}/min

## 2018-03-31 LAB — CBC (HEMOGRAM)
Hematocrit: 34 % — ABNORMAL LOW (ref 38–50)
Hemoglobin: 10.5 g/dL — ABNORMAL LOW (ref 13.0–18.0)
MCH: 30.9 pg (ref 27.3–33.6)
MCHC: 31.2 g/dL — ABNORMAL LOW (ref 32.2–36.5)
MCV: 99 fL — ABNORMAL HIGH (ref 81–98)
Platelet Count: 241 10*3/uL (ref 150–400)
RBC: 3.4 10*6/uL — ABNORMAL LOW (ref 4.40–5.60)
RDW-CV: 15.3 % — ABNORMAL HIGH (ref 11.6–14.4)
WBC: 4.66 10*3/uL (ref 4.3–10.0)

## 2018-03-31 LAB — GLUCOSE POC, ~~LOC~~
Glucose (POC): 104 mg/dL (ref 62–125)
Glucose (POC): 200 mg/dL — ABNORMAL HIGH (ref 62–125)
Glucose (POC): 134 mg/dL — ABNORMAL HIGH (ref 62–125)
Glucose (POC): 186 mg/dL — ABNORMAL HIGH (ref 62–125)

## 2018-03-31 LAB — BASIC METABOLIC PANEL
Anion Gap: 5 (ref 4–12)
Calcium: 7.8 mg/dL — ABNORMAL LOW (ref 8.9–10.2)
Carbon Dioxide, Total: 29 meq/L (ref 22–32)
Chloride: 107 meq/L (ref 98–108)
Creatinine: 1.1 mg/dL (ref 0.51–1.18)
GFR, Calc, African American: >60 mL/min/{1.73_m2} (ref >59)
GFR, Calc, European American: >60 mL/min/{1.73_m2} (ref >59)
Glucose: 96 mg/dL (ref 62–125)
Potassium: 3.9 meq/L (ref 3.6–5.2)
Sodium: 141 meq/L (ref 135–145)
Urea Nitrogen: 8 mg/dL (ref 8–21)

## 2018-03-31 LAB — MAGNESIUM: Magnesium: 1.9 mg/dL (ref 1.8–2.4)

## 2018-03-31 LAB — PHOSPHATE: Phosphate: 3.1 mg/dL (ref 2.5–4.5)

## 2018-03-31 LAB — POTASSIUM, SERUM: Potassium: 4.1 meq/L (ref 3.6–5.2)

## 2018-04-01 LAB — PROTHROMBIN TIME
Prothrombin INR: 1 (ref 0.8–1.3)
Prothrombin Time Patient: 13.3 s (ref 10.7–15.6)

## 2018-04-01 LAB — EKG 12 LEAD
Atrial Rate: 67 {beats}/min
P Axis: 9 degrees
P-R Interval: 186 ms
Q-T Interval: 488 ms
QRS Duration: 124 ms
QTC Calculation: 515 ms
R Axis: 78 degrees
T Axis: 98 degrees
Ventricular Rate: 67 {beats}/min

## 2018-04-01 LAB — BASIC METABOLIC PANEL
Anion Gap: 8 (ref 4–12)
Calcium: 8 mg/dL — ABNORMAL LOW (ref 8.9–10.2)
Carbon Dioxide, Total: 27 meq/L (ref 22–32)
Chloride: 106 meq/L (ref 98–108)
Creatinine: 1.07 mg/dL (ref 0.51–1.18)
GFR, Calc, African American: >60 mL/min/{1.73_m2} (ref >59)
GFR, Calc, European American: >60 mL/min/{1.73_m2} (ref >59)
Glucose: 95 mg/dL (ref 62–125)
Potassium: 3.9 meq/L (ref 3.6–5.2)
Sodium: 141 meq/L (ref 135–145)
Urea Nitrogen: 9 mg/dL (ref 8–21)

## 2018-04-01 LAB — GLUCOSE POC, ~~LOC~~
Glucose (POC): 99 mg/dL (ref 62–125)
Glucose (POC): 176 mg/dL — ABNORMAL HIGH (ref 62–125)
Glucose (POC): 152 mg/dL — ABNORMAL HIGH (ref 62–125)
Glucose (POC): 174 mg/dL — ABNORMAL HIGH (ref 62–125)
Glucose (POC): 157 mg/dL — ABNORMAL HIGH (ref 62–125)

## 2018-04-01 LAB — CBC (HEMOGRAM)
Hematocrit: 33 % — ABNORMAL LOW (ref 38–50)
Hemoglobin: 10.7 g/dL — ABNORMAL LOW (ref 13.0–18.0)
MCH: 31.8 pg (ref 27.3–33.6)
MCHC: 32.1 g/dL — ABNORMAL LOW (ref 32.2–36.5)
MCV: 99 fL — ABNORMAL HIGH (ref 81–98)
Platelet Count: 238 10*3/uL (ref 150–400)
RBC: 3.36 10*6/uL — ABNORMAL LOW (ref 4.40–5.60)
RDW-CV: 15.4 % — ABNORMAL HIGH (ref 11.6–14.4)
WBC: 5.22 10*3/uL (ref 4.3–10.0)

## 2018-04-01 LAB — MAGNESIUM: Magnesium: 1.8 mg/dL (ref 1.8–2.4)

## 2018-04-01 LAB — PHOSPHATE: Phosphate: 2.8 mg/dL (ref 2.5–4.5)

## 2018-04-02 ENCOUNTER — Other Ambulatory Visit: Payer: Self-pay

## 2018-04-02 DIAGNOSIS — R079 Chest pain, unspecified: Secondary | ICD-10-CM

## 2018-04-02 LAB — BASIC METABOLIC PANEL
Anion Gap: 7 (ref 4–12)
Calcium: 8 mg/dL — ABNORMAL LOW (ref 8.9–10.2)
Carbon Dioxide, Total: 28 meq/L (ref 22–32)
Chloride: 107 meq/L (ref 98–108)
Creatinine: 1.1 mg/dL (ref 0.51–1.18)
GFR, Calc, African American: >60 mL/min/{1.73_m2} (ref >59)
GFR, Calc, European American: >60 mL/min/{1.73_m2} (ref >59)
Glucose: 108 mg/dL (ref 62–125)
Potassium: 3.9 meq/L (ref 3.6–5.2)
Sodium: 142 meq/L (ref 135–145)
Urea Nitrogen: 9 mg/dL (ref 8–21)

## 2018-04-02 LAB — PROTHROMBIN TIME
Prothrombin INR: 1.1 (ref 0.8–1.3)
Prothrombin Time Patient: 13.9 s (ref 10.7–15.6)

## 2018-04-02 LAB — PHOSPHATE: Phosphate: 3 mg/dL (ref 2.5–4.5)

## 2018-04-02 LAB — GLUCOSE POC, ~~LOC~~
Glucose (POC): 114 mg/dL (ref 62–125)
Glucose (POC): 142 mg/dL — ABNORMAL HIGH (ref 62–125)
Glucose (POC): 163 mg/dL — ABNORMAL HIGH (ref 62–125)
Glucose (POC): 165 mg/dL — ABNORMAL HIGH (ref 62–125)

## 2018-04-02 LAB — CBC (HEMOGRAM)
Hematocrit: 34 % — ABNORMAL LOW (ref 38–50)
Hemoglobin: 10.4 g/dL — ABNORMAL LOW (ref 13.0–18.0)
MCH: 30.9 pg (ref 27.3–33.6)
MCHC: 31 g/dL — ABNORMAL LOW (ref 32.2–36.5)
MCV: 99 fL — ABNORMAL HIGH (ref 81–98)
Platelet Count: 227 10*3/uL (ref 150–400)
RBC: 3.37 10*6/uL — ABNORMAL LOW (ref 4.40–5.60)
RDW-CV: 15.5 % — ABNORMAL HIGH (ref 11.6–14.4)
WBC: 4.28 10*3/uL — ABNORMAL LOW (ref 4.3–10.0)

## 2018-04-02 LAB — MAGNESIUM: Magnesium: 1.8 mg/dL (ref 1.8–2.4)

## 2018-04-03 DIAGNOSIS — I472 Ventricular tachycardia: Secondary | ICD-10-CM

## 2018-04-03 DIAGNOSIS — Z95811 Presence of heart assist device: Secondary | ICD-10-CM

## 2018-04-03 LAB — GLUCOSE POC, ~~LOC~~
Glucose (POC): 125 mg/dL (ref 62–125)
Glucose (POC): 183 mg/dL — ABNORMAL HIGH (ref 62–125)
Glucose (POC): 208 mg/dL — ABNORMAL HIGH (ref 62–125)

## 2018-04-03 LAB — PROTHROMBIN TIME
Prothrombin INR: 1.1 (ref 0.8–1.3)
Prothrombin Time Patient: 14.8 s (ref 10.7–15.6)

## 2018-04-03 LAB — CBC (HEMOGRAM)
Hematocrit: 31 % — ABNORMAL LOW (ref 38–50)
Hemoglobin: 10.1 g/dL — ABNORMAL LOW (ref 13.0–18.0)
MCH: 32.2 pg (ref 27.3–33.6)
MCHC: 32.6 g/dL (ref 32.2–36.5)
MCV: 99 fL — ABNORMAL HIGH (ref 81–98)
Platelet Count: 230 10*3/uL (ref 150–400)
RBC: 3.14 10*6/uL — ABNORMAL LOW (ref 4.40–5.60)
RDW-CV: 15.5 % — ABNORMAL HIGH (ref 11.6–14.4)
WBC: 5.04 10*3/uL (ref 4.3–10.0)

## 2018-04-03 LAB — BASIC METABOLIC PANEL
Anion Gap: 7 (ref 4–12)
Calcium: 7.7 mg/dL — ABNORMAL LOW (ref 8.9–10.2)
Carbon Dioxide, Total: 28 meq/L (ref 22–32)
Chloride: 106 meq/L (ref 98–108)
Creatinine: 1.11 mg/dL (ref 0.51–1.18)
GFR, Calc, African American: >60 mL/min/{1.73_m2} (ref >59)
GFR, Calc, European American: >60 mL/min/{1.73_m2} (ref >59)
Glucose: 123 mg/dL (ref 62–125)
Potassium: 4 meq/L (ref 3.6–5.2)
Sodium: 141 meq/L (ref 135–145)
Urea Nitrogen: 10 mg/dL (ref 8–21)

## 2018-04-03 LAB — MAGNESIUM: Magnesium: 1.7 mg/dL — ABNORMAL LOW (ref 1.8–2.4)

## 2018-04-03 LAB — PHOSPHATE: Phosphate: 3.3 mg/dL (ref 2.5–4.5)

## 2018-04-04 LAB — PHOSPHATE: Phosphate: 2.8 mg/dL (ref 2.5–4.5)

## 2018-04-04 LAB — GLUCOSE POC, ~~LOC~~
Glucose (POC): 221 mg/dL — ABNORMAL HIGH (ref 62–125)
Glucose (POC): 92 mg/dL (ref 62–125)
Glucose (POC): 113 mg/dL (ref 62–125)
Glucose (POC): 113 mg/dL (ref 62–125)
Glucose (POC): 137 mg/dL — ABNORMAL HIGH (ref 62–125)
Glucose (POC): 159 mg/dL — ABNORMAL HIGH (ref 62–125)
Glucose (POC): 199 mg/dL — ABNORMAL HIGH (ref 62–125)

## 2018-04-04 LAB — BASIC METABOLIC PANEL
Anion Gap: 7 (ref 4–12)
Calcium: 8.2 mg/dL — ABNORMAL LOW (ref 8.9–10.2)
Carbon Dioxide, Total: 28 meq/L (ref 22–32)
Chloride: 106 meq/L (ref 98–108)
Creatinine: 1.11 mg/dL (ref 0.51–1.18)
GFR, Calc, African American: >60 mL/min/{1.73_m2} (ref >59)
GFR, Calc, European American: >60 mL/min/{1.73_m2} (ref >59)
Glucose: 120 mg/dL (ref 62–125)
Potassium: 3.8 meq/L (ref 3.6–5.2)
Sodium: 141 meq/L (ref 135–145)
Urea Nitrogen: 9 mg/dL (ref 8–21)

## 2018-04-04 LAB — CBC (HEMOGRAM)
Hematocrit: 32 % — ABNORMAL LOW (ref 38–50)
Hemoglobin: 10.2 g/dL — ABNORMAL LOW (ref 13.0–18.0)
MCH: 31.7 pg (ref 27.3–33.6)
MCHC: 32.2 g/dL (ref 32.2–36.5)
MCV: 98 fL (ref 81–98)
Platelet Count: 208 10*3/uL (ref 150–400)
RBC: 3.22 10*6/uL — ABNORMAL LOW (ref 4.40–5.60)
RDW-CV: 15.5 % — ABNORMAL HIGH (ref 11.6–14.4)
WBC: 5.34 10*3/uL (ref 4.3–10.0)

## 2018-04-04 LAB — PROTHROMBIN TIME
Prothrombin INR: 1.2 (ref 0.8–1.3)
Prothrombin Time Patient: 14.9 s (ref 10.7–15.6)

## 2018-04-04 LAB — MAGNESIUM
Magnesium: 1.9 mg/dL (ref 1.8–2.4)
Magnesium: 2.2 mg/dL (ref 1.8–2.4)

## 2018-04-04 LAB — POTASSIUM, SERUM: Potassium: 3.8 meq/L (ref 3.6–5.2)

## 2018-04-05 DIAGNOSIS — I25118 Atherosclerotic heart disease of native coronary artery with other forms of angina pectoris: Secondary | ICD-10-CM

## 2018-04-05 LAB — BASIC METABOLIC PANEL
Anion Gap: 7 (ref 4–12)
Calcium: 8.1 mg/dL — ABNORMAL LOW (ref 8.9–10.2)
Carbon Dioxide, Total: 27 meq/L (ref 22–32)
Chloride: 107 meq/L (ref 98–108)
Creatinine: 1.07 mg/dL (ref 0.51–1.18)
GFR, Calc, African American: >60 mL/min/{1.73_m2} (ref >59)
GFR, Calc, European American: >60 mL/min/{1.73_m2} (ref >59)
Glucose: 147 mg/dL — ABNORMAL HIGH (ref 62–125)
Potassium: 4.1 meq/L (ref 3.6–5.2)
Sodium: 141 meq/L (ref 135–145)
Urea Nitrogen: 8 mg/dL (ref 8–21)

## 2018-04-05 LAB — CBC (HEMOGRAM)
Hematocrit: 34 % — ABNORMAL LOW (ref 38–50)
Hemoglobin: 10.9 g/dL — ABNORMAL LOW (ref 13.0–18.0)
MCH: 31.1 pg (ref 27.3–33.6)
MCHC: 31.7 g/dL — ABNORMAL LOW (ref 32.2–36.5)
MCV: 98 fL (ref 81–98)
Platelet Count: 226 10*3/uL (ref 150–400)
RBC: 3.5 10*6/uL — ABNORMAL LOW (ref 4.40–5.60)
RDW-CV: 15.4 % — ABNORMAL HIGH (ref 11.6–14.4)
WBC: 5.29 10*3/uL (ref 4.3–10.0)

## 2018-04-05 LAB — GLUCOSE POC, ~~LOC~~
Glucose (POC): 141 mg/dL — ABNORMAL HIGH (ref 62–125)
Glucose (POC): 136 mg/dL — ABNORMAL HIGH (ref 62–125)
Glucose (POC): 254 mg/dL — ABNORMAL HIGH (ref 62–125)
Glucose (POC): 109 mg/dL (ref 62–125)

## 2018-04-05 LAB — MAGNESIUM: Magnesium: 2.1 mg/dL (ref 1.8–2.4)

## 2018-04-05 LAB — PROTHROMBIN TIME
Prothrombin INR: 1.2 (ref 0.8–1.3)
Prothrombin Time Patient: 15.3 s (ref 10.7–15.6)

## 2018-04-05 LAB — PHOSPHATE: Phosphate: 2.9 mg/dL (ref 2.5–4.5)

## 2018-04-06 LAB — CBC (HEMOGRAM)
Hematocrit: 34 % — ABNORMAL LOW (ref 38–50)
Hemoglobin: 11 g/dL — ABNORMAL LOW (ref 13.0–18.0)
MCH: 32.2 pg (ref 27.3–33.6)
MCHC: 32.8 g/dL (ref 32.2–36.5)
MCV: 98 fL (ref 81–98)
Platelet Count: 229 10*3/uL (ref 150–400)
RBC: 3.42 10*6/uL — ABNORMAL LOW (ref 4.40–5.60)
RDW-CV: 15.7 % — ABNORMAL HIGH (ref 11.6–14.4)
WBC: 6.63 10*3/uL (ref 4.3–10.0)

## 2018-04-06 LAB — BASIC METABOLIC PANEL
Anion Gap: 8 (ref 4–12)
Calcium: 7.9 mg/dL — ABNORMAL LOW (ref 8.9–10.2)
Carbon Dioxide, Total: 26 meq/L (ref 22–32)
Chloride: 106 meq/L (ref 98–108)
Creatinine: 1.21 mg/dL — ABNORMAL HIGH (ref 0.51–1.18)
GFR, Calc, African American: >60 mL/min/{1.73_m2} (ref >59)
GFR, Calc, European American: 59 mL/min/{1.73_m2} — ABNORMAL LOW (ref >59)
Glucose: 146 mg/dL — ABNORMAL HIGH (ref 62–125)
Potassium: 3.9 meq/L (ref 3.6–5.2)
Sodium: 140 meq/L (ref 135–145)
Urea Nitrogen: 10 mg/dL (ref 8–21)

## 2018-04-06 LAB — GLUCOSE POC, ~~LOC~~
Glucose (POC): 133 mg/dL — ABNORMAL HIGH (ref 62–125)
Glucose (POC): 185 mg/dL — ABNORMAL HIGH (ref 62–125)
Glucose (POC): 156 mg/dL — ABNORMAL HIGH (ref 62–125)
Glucose (POC): 164 mg/dL — ABNORMAL HIGH (ref 62–125)

## 2018-04-06 LAB — PROTHROMBIN TIME
Prothrombin INR: 1.1 (ref 0.8–1.3)
Prothrombin Time Patient: 14.2 s (ref 10.7–15.6)

## 2018-04-06 LAB — PHOSPHATE: Phosphate: 3.1 mg/dL (ref 2.5–4.5)

## 2018-04-06 LAB — MAGNESIUM: Magnesium: 1.8 mg/dL (ref 1.8–2.4)

## 2018-04-07 LAB — PHOSPHATE: Phosphate: 2.3 mg/dL — ABNORMAL LOW (ref 2.5–4.5)

## 2018-04-07 LAB — CBC (HEMOGRAM)
Hematocrit: 28 % — ABNORMAL LOW (ref 38–50)
Hematocrit: 33 % — ABNORMAL LOW (ref 38–50)
Hemoglobin: 9.3 g/dL — ABNORMAL LOW (ref 13.0–18.0)
Hemoglobin: 10.4 g/dL — ABNORMAL LOW (ref 13.0–18.0)
MCH: 32.2 pg (ref 27.3–33.6)
MCH: 31.5 pg (ref 27.3–33.6)
MCHC: 32.9 g/dL (ref 32.2–36.5)
MCHC: 31.6 g/dL — ABNORMAL LOW (ref 32.2–36.5)
MCV: 98 fL (ref 81–98)
MCV: 100 fL — ABNORMAL HIGH (ref 81–98)
Platelet Count: 189 10*3/uL (ref 150–400)
Platelet Count: 209 10*3/uL (ref 150–400)
RBC: 2.89 10*6/uL — ABNORMAL LOW (ref 4.40–5.60)
RBC: 3.3 10*6/uL — ABNORMAL LOW (ref 4.40–5.60)
RDW-CV: 15.3 % — ABNORMAL HIGH (ref 11.6–14.4)
RDW-CV: 15.6 % — ABNORMAL HIGH (ref 11.6–14.4)
WBC: 5.94 10*3/uL (ref 4.3–10.0)
WBC: 5.36 10*3/uL (ref 4.3–10.0)

## 2018-04-07 LAB — BASIC METABOLIC PANEL
Anion Gap: 14 — ABNORMAL HIGH (ref 4–12)
Anion Gap: 6 (ref 4–12)
Calcium: 6.5 mg/dL — ABNORMAL LOW (ref 8.9–10.2)
Calcium: 8.1 mg/dL — ABNORMAL LOW (ref 8.9–10.2)
Carbon Dioxide, Total: 21 meq/L — ABNORMAL LOW (ref 22–32)
Carbon Dioxide, Total: 26 meq/L (ref 22–32)
Chloride: 112 meq/L — ABNORMAL HIGH (ref 98–108)
Chloride: 106 meq/L (ref 98–108)
Creatinine: 0.92 mg/dL (ref 0.51–1.18)
Creatinine: 1.34 mg/dL — ABNORMAL HIGH (ref 0.51–1.18)
GFR, Calc, African American: >60 mL/min/{1.73_m2} (ref >59)
GFR, Calc, African American: >60 mL/min/{1.73_m2} (ref >59)
GFR, Calc, European American: >60 mL/min/{1.73_m2} (ref >59)
GFR, Calc, European American: 53 mL/min/{1.73_m2} — ABNORMAL LOW (ref >59)
Glucose: 120 mg/dL (ref 62–125)
Glucose: 201 mg/dL — ABNORMAL HIGH (ref 62–125)
Potassium: 3.3 meq/L — ABNORMAL LOW (ref 3.6–5.2)
Potassium: 4.2 meq/L (ref 3.6–5.2)
Sodium: 147 meq/L — ABNORMAL HIGH (ref 135–145)
Sodium: 138 meq/L (ref 135–145)
Urea Nitrogen: 11 mg/dL (ref 8–21)
Urea Nitrogen: 12 mg/dL (ref 8–21)

## 2018-04-07 LAB — GLUCOSE POC, ~~LOC~~
Glucose (POC): 141 mg/dL — ABNORMAL HIGH (ref 62–125)
Glucose (POC): 169 mg/dL — ABNORMAL HIGH (ref 62–125)
Glucose (POC): 133 mg/dL — ABNORMAL HIGH (ref 62–125)
Glucose (POC): 238 mg/dL — ABNORMAL HIGH (ref 62–125)

## 2018-04-07 LAB — MAGNESIUM
Magnesium: 1.4 mg/dL — ABNORMAL LOW (ref 1.8–2.4)
Magnesium: 1.8 mg/dL (ref 1.8–2.4)

## 2018-04-07 LAB — PROTHROMBIN TIME
Prothrombin INR: 1.3 (ref 0.8–1.3)
Prothrombin Time Patient: 16.2 s — ABNORMAL HIGH (ref 10.7–15.6)

## 2018-04-07 LAB — POTASSIUM, SERUM: Potassium: 4.5 meq/L (ref 3.6–5.2)

## 2018-04-08 LAB — EKG 12 LEAD
Atrial Rate: 61 {beats}/min
Atrial Rate: 67 {beats}/min
P Axis: 82 degrees
P-R Interval: 200 ms
P-R Interval: 200 ms
Q-T Interval: 502 ms
Q-T Interval: 482 ms
QRS Duration: 126 ms
QRS Duration: 120 ms
QTC Calculation: 505 ms
QTC Calculation: 509 ms
R Axis: 72 degrees
R Axis: 82 degrees
T Axis: 99 degrees
T Axis: 78 degrees
Ventricular Rate: 61 {beats}/min
Ventricular Rate: 67 {beats}/min

## 2018-04-08 LAB — GLUCOSE POC, ~~LOC~~
Glucose (POC): 138 mg/dL — ABNORMAL HIGH (ref 62–125)
Glucose (POC): 133 mg/dL — ABNORMAL HIGH (ref 62–125)
Glucose (POC): 189 mg/dL — ABNORMAL HIGH (ref 62–125)

## 2018-04-13 ENCOUNTER — Telehealth (HOSPITAL_BASED_OUTPATIENT_CLINIC_OR_DEPARTMENT_OTHER): Payer: Self-pay

## 2018-04-13 NOTE — Telephone Encounter (Addendum)
Attempt to reach patient at phone number listed. No answer, LVM x1 to return call to [facility] direct phone number at [phone]. IDRN is attempting to follow up with patient if he is able to infuse IV abx:    Called [facility] for confirmation re: [place] office for patient. Left detailed message to return call to this RN at current phone number [phone].    UPDATE:     This RN called:     1. Keith Ellison (DPOA) LVM x1 to return call  2. Keith Ellison (DTR) LVM x1 to return call  3. Keith Ellison (MIL of Keith Ellison) LVM x1 to return call  4. [place] RPh, Channing Mutters [phone]) has not been able to reach patient.   5. [facility] [phone]) spoke with Keith Relic, RN. Unable to reach patient and had done a well check and unable to find residence per address: [address], [place], [place].   6. Called PCP office [phone]) and spoke with Keith Belton, RN patient had NS on appointment today and unable to reach patient and/or emergency contact person.   7. Called local ED within 5 mi radius of [place], [place] and no one with patient's name has been seen and/or admitted at this time.     Note routed to [employer], [place]

## 2018-04-19 NOTE — Telephone Encounter (Signed)
Spoke with Gigi Gin, RPh at [facility] that patient had returned to [place], [place]. I saw in [place] that per Arlester Marker, MSW patient came to [facility] family room accompanied by family members (Son and dtr in Social worker). Was planning to go to ED, however, no ED notification found.     This RN called patient's phone number which are all Son's number as DPOA. Phone goes straight to VM. Left a detailed VM, once again, to return call at [phone]. RN is very concern as patient has a PICC line that has been in without any care to it since d/c on 1/30. No IV abx given since d/c as well.

## 2018-04-27 NOTE — Telephone Encounter (Signed)
IDRN called DPOA LVM x1    Called HH in [place] for update if they had heard of patient. Unfortunately, they have not heard from patient since the update that patient had flew back to [place] on 2/4.     HH in [place] will call IDRN at [facility] if/when patient reach out.

## 2018-05-17 NOTE — Telephone Encounter (Signed)
Upon review on patient's chart. Patient has been admitted in [facility] at [place], [place] on 05/14/18.     Note routed to [employer], [place]

## 7363-06-09 DEATH — deceased
